# Patient Record
Sex: Female | Born: 1995 | Race: Black or African American | Hispanic: No | Marital: Single | Smoking: Never smoker
Health system: Southern US, Community
[De-identification: ages and names within clinical notes are randomized; demographics above are authoritative.]

## PROBLEM LIST (undated history)

## (undated) DIAGNOSIS — Z789 Other specified health status: Secondary | ICD-10-CM

## (undated) DIAGNOSIS — L239 Allergic contact dermatitis, unspecified cause: Secondary | ICD-10-CM

## (undated) DIAGNOSIS — Z9089 Acquired absence of other organs: Secondary | ICD-10-CM

## (undated) DIAGNOSIS — L709 Acne, unspecified: Secondary | ICD-10-CM

## (undated) HISTORY — DX: Allergic contact dermatitis, unspecified cause: L23.9

## (undated) HISTORY — PX: WISDOM TOOTH EXTRACTION: SHX21

## (undated) HISTORY — PX: ADENOIDECTOMY: SUR15

## (undated) HISTORY — DX: Acne, unspecified: L70.9

## (undated) HISTORY — PX: TONSILLECTOMY: SUR1361

---
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
## 2015-01-10 ENCOUNTER — Telehealth: Payer: Self-pay | Admitting: Family Medicine

## 2015-01-10 NOTE — Telephone Encounter (Signed)
Pt would like to know when she will get an appt to the nutritionists.

## 2015-01-11 NOTE — Telephone Encounter (Signed)
Called patient, had to leave message.

## 2015-01-20 ENCOUNTER — Emergency Department
Admission: EM | Admit: 2015-01-20 | Discharge: 2015-01-20 | Disposition: A | Discharge status: Home / Self Care | Payer: Medicaid Other | Attending: Emergency Medicine | Admitting: Emergency Medicine

## 2015-01-20 DIAGNOSIS — Z3202 Encounter for pregnancy test, result negative: Secondary | ICD-10-CM | POA: Diagnosis not present

## 2015-01-20 DIAGNOSIS — M545 Low back pain, unspecified: Secondary | ICD-10-CM

## 2015-01-20 DIAGNOSIS — R51 Headache: Secondary | ICD-10-CM | POA: Diagnosis not present

## 2015-01-20 DIAGNOSIS — Z79899 Other long term (current) drug therapy: Secondary | ICD-10-CM | POA: Insufficient documentation

## 2015-01-20 DIAGNOSIS — R519 Headache, unspecified: Secondary | ICD-10-CM

## 2015-01-20 DIAGNOSIS — R109 Unspecified abdominal pain: Secondary | ICD-10-CM | POA: Insufficient documentation

## 2015-01-20 LAB — URINALYSIS COMPLETE WITH MICROSCOPIC (ARMC ONLY)
BILIRUBIN URINE: NEGATIVE
Glucose, UA: NEGATIVE mg/dL
HGB URINE DIPSTICK: NEGATIVE
Ketones, ur: NEGATIVE mg/dL
Leukocytes, UA: NEGATIVE
Nitrite: NEGATIVE
PH: 6 (ref 5.0–8.0)
Protein, ur: NEGATIVE mg/dL
Specific Gravity, Urine: 1.013 (ref 1.005–1.030)

## 2015-01-20 MED ORDER — ACETAMINOPHEN 325 MG PO TABS
650.0000 mg | ORAL_TABLET | Freq: Once | ORAL | Status: AC
  Administered 2015-01-20: 650 mg via ORAL
  Filled 2015-01-20: qty 2

## 2015-01-20 NOTE — ED Notes (Signed)
POC urine pregnancy negative. 

## 2015-01-20 NOTE — ED Notes (Signed)
Pt ambulatory to triage with reports that she began having headache and rt flank pain x 3 days. Denies nvd.

## 2015-01-20 NOTE — ED Provider Notes (Addendum)
[facility] Emergency Department Provider Note  ____________________________________________  Time seen: Approximately 9:34 AM  I have reviewed the triage vital signs and the nursing notes.   HISTORY  Chief Complaint Flank Pain and Headache    HPI Ebony [name] is a 19 y.o. female presents today complaining of flank pain on the right. Possibly mild dysuria "I guess maybe". Has had this for a few days. No fever no chills no nausea no vomiting. No abdominal pain. States she has a history of migraines and has a very slight non-worst headache of life headache for the last day or 2 as well. She presents with another patient in the same room with URI complaints. Patient herself has had no URI complaints. She denies any vaginal discharge, she has had no diarrhea, she has had no melena or bright blood per rectum. She does state that she has had pain like this before. She denies any numbness or weakness. No recent trauma.  Past Medical History  Diagnosis Date  . History of tonsillectomy   . Acne   . Eczema, allergic     Patient Active Problem List   Diagnosis Date Noted  . Obesity, pediatric, BMI 95th to 98th percentile for age 61/15/2016  . Intermenstrual bleeding 12/19/2014  . Allergic contact dermatitis 12/17/2014    Past Surgical History  Procedure Laterality Date  . Tonsillectomy    . Adenoidectomy      Current Outpatient Rx  Name  Route  Sig  Dispense  Refill  . etonogestrel (NEXPLANON) 68 MG IMPL implant   Subcutaneous   Inject into the skin.         Marland Kitchen triamcinolone cream (KENALOG) 0.1 %   Topical   Apply 1 application topically daily.   80 g   0     Allergies Review of patient's allergies indicates no known allergies.  Family History  Problem Relation Age of Onset  . Diabetes Brother     Social History Social History  Substance Use Topics  . Smoking status: Never Smoker   . Smokeless tobacco: Never Used  . Alcohol Use: No     Review of Systems Constitutional: No fever/chills Eyes: No visual changes. ENT: No sore throat. Cardiovascular: Denies chest pain. Respiratory: Denies shortness of breath. Gastrointestinal: No abdominal pain.  No nausea, no vomiting.  No diarrhea.  No constipation. Genitourinary: "Maybe a little" dysuria. Musculoskeletal: Negative for back pain. Skin: Negative for rash. Neurological: Negative for headaches, focal weakness or numbness.  10-point ROS otherwise negative.  ____________________________________________   PHYSICAL EXAM:  VITAL SIGNS: ED Triage Vitals  Enc Vitals Group     BP 01/20/15 0841 121/74 mmHg     Pulse Rate 01/20/15 0841 91     Resp 01/20/15 0841 18     Temp 01/20/15 0841 98.4 F (36.9 C)     Temp Source 01/20/15 0841 Oral     SpO2 01/20/15 0841 97 %     Weight 01/20/15 0841 184 lb (83.462 kg)     Height 01/20/15 0841 5\' 6"  (1.676 m)     Head Cir --      Peak Flow --      Pain Score 01/20/15 0842 10     Pain Loc --      Pain Edu? --      Excl. in GC? --     Constitutional: Alert and oriented. Well appearing and in no acute distress. Laughing and joking with me about her child Eyes: Conjunctivae  are normal. PERRL. EOMI. Head: Atraumatic. Nose: No congestion/rhinnorhea. Mouth/Throat: Mucous membranes are moist.  Oropharynx non-erythematous. Neck: No stridor.   Cardiovascular: Normal rate, regular rhythm. Grossly normal heart sounds.  Good peripheral circulation. Respiratory: Normal respiratory effort.  No retractions. Lungs CTAB. Gastrointestinal: Soft and nontender. No distention. No abdominal bruits. There is slight right CVA tenderness by goes towards the midback Musculoskeletal: No lower extremity tenderness nor edema.  No joint effusions. Neurologic:  Normal speech and language. No gross focal neurologic deficits are appreciated. No gait instability. Skin:  Skin is warm, dry and intact. No rash noted. Psychiatric: Mood and affect are  normal. Speech and behavior are normal.  ____________________________________________   LABS (all labs ordered are listed, but only abnormal results are displayed)  Labs Reviewed  URINE CULTURE  URINALYSIS COMPLETEWITH MICROSCOPIC ([facility] ONLY)   ____________________________________________  _____________________________   PROCEDURES  Procedure(s) performed: None  Critical Care performed: None  ____________________________________________   INITIAL IMPRESSION / ASSESSMENT AND PLAN / ED COURSE  Pertinent labs & imaging results that were available during my care of the patient were reviewed by me and considered in my medical decision making (see chart for details).  Patient is very well-appearing presents today with another patient in the room. She is being checked out because she was here anyway. Patient has had some mild right flank pain. On exam she does have some tenderness there. There is however no evidence of midline tenderness nothing to suggest cauda equina syndrome, there is no abdominal pain there's nothing to suggest at this time referred gallbladder or appendicitis. Could be muscle related back pain or possibly pyelonephritis although lower suspicion. We will check a urine and a urine pregnancy and reassess. Patient also states she has a slight headache which she gets "all the time". She is in no way in any distress and do not think an LP is warranted I do not think a CT is warranted and do not think she has a head bleed I do not think the patient has meningitis or a mass.   ----------------------------------------- 11:14 AM on 01/20/2015 -----------------------------------------  Patient states she feels much better, headache nearly gone, remains fully nonfocal neurologically with no stiff neck or meningismus. At this time, she states her back pain is better as well. She has no flank tenderness only a little bit of paraspinal muscle tenderness. She remains without any  evidence of cauda equina syndrome. Patient feels much better and is eager to go home. We'll discharge her, she has been having these headaches for years off and on, low grade, we will refer her as an outpatient for further evaluation of this. Return precautions for any new or worsening symptoms including headache numbness weakness fever chills nausea vomiting etc. given and understood. Patient also notes that she must follow-up with her child who has a URI.  Of note, she is laughing and joking in the room and well-appearing. She now recollects picking up her son and straining her back doing that. However there is no midline back pain and nothing to indicate the need for imaging at this time. I have advised her to continue using over-the-counter analgesics. She will return if she feels worse. Urine pregnancy was negative. ____________________________________________   FINAL CLINICAL IMPRESSION(S) / ED DIAGNOSES  Final diagnoses:  None     Jeanmarie Plant, MD 01/20/15 1610  Jeanmarie Plant, MD 01/20/15 1115  Jeanmarie Plant, MD 01/20/15 [phone]

## 2015-01-21 LAB — POCT PREGNANCY, URINE: PREG TEST UR: NEGATIVE

## 2015-01-22 LAB — URINE CULTURE: Special Requests: NORMAL

## 2015-03-02 ENCOUNTER — Emergency Department
Admission: EM | Admit: 2015-03-02 | Discharge: 2015-03-02 | Disposition: A | Discharge status: Home / Self Care | Payer: Medicaid Other | Attending: Emergency Medicine | Admitting: Emergency Medicine

## 2015-03-02 ENCOUNTER — Encounter: Payer: Self-pay | Admitting: Emergency Medicine

## 2015-03-02 DIAGNOSIS — Z7952 Long term (current) use of systemic steroids: Secondary | ICD-10-CM | POA: Diagnosis not present

## 2015-03-02 DIAGNOSIS — Z79899 Other long term (current) drug therapy: Secondary | ICD-10-CM | POA: Insufficient documentation

## 2015-03-02 DIAGNOSIS — J0100 Acute maxillary sinusitis, unspecified: Secondary | ICD-10-CM | POA: Insufficient documentation

## 2015-03-02 DIAGNOSIS — R05 Cough: Secondary | ICD-10-CM | POA: Diagnosis present

## 2015-03-02 DIAGNOSIS — J029 Acute pharyngitis, unspecified: Secondary | ICD-10-CM | POA: Insufficient documentation

## 2015-03-02 DIAGNOSIS — H6503 Acute serous otitis media, bilateral: Secondary | ICD-10-CM

## 2015-03-02 MED ORDER — GUAIFENESIN-CODEINE 100-10 MG/5ML PO SOLN: 10.0000 mL | Freq: Three times a day (TID) | ORAL | Status: DC | PRN

## 2015-03-02 MED ORDER — CETIRIZINE HCL 10 MG PO CAPS: 10.0000 mg | ORAL_CAPSULE | Freq: Every day | ORAL | Status: DC

## 2015-03-02 MED ORDER — AMOXICILLIN 500 MG PO TABS: 500.0000 mg | ORAL_TABLET | Freq: Three times a day (TID) | ORAL | Status: DC

## 2015-03-02 NOTE — ED Provider Notes (Signed)
[facility] Emergency Department Provider Note ____________________________________________  Time seen: Approximately 12:18 PM  I have reviewed the triage vital signs and the nursing notes.   HISTORY  Chief Complaint Cough; Sore Throat; and Generalized Body Aches    HPI Ebony [name] is a 19 y.o. female who presents to the emergency department for evaluation of sinus congestion, cough, sore throat, and generalized body aches for 3 weeks with increase in symptoms today.No relief with OTC medications. No sick contacts.    Past Medical History  Diagnosis Date  . History of tonsillectomy   . Acne   . Eczema, allergic     Patient Active Problem List   Diagnosis Date Noted  . Obesity, pediatric, BMI 95th to 98th percentile for age 72/15/2016  . Intermenstrual bleeding 12/19/2014  . Allergic contact dermatitis 12/17/2014    Past Surgical History  Procedure Laterality Date  . Tonsillectomy    . Adenoidectomy      Current Outpatient Rx  Name  Route  Sig  Dispense  Refill  . amoxicillin (AMOXIL) 500 MG tablet   Oral   Take 1 tablet (500 mg total) by mouth 3 (three) times daily.   30 tablet   0   . Cetirizine HCl 10 MG CAPS   Oral   Take 1 capsule (10 mg total) by mouth daily.   30 capsule   3   . etonogestrel (NEXPLANON) 68 MG IMPL implant   Subcutaneous   Inject into the skin.         Marland Kitchen guaiFENesin-codeine 100-10 MG/5ML syrup   Oral   Take 10 mLs by mouth 3 (three) times daily as needed.   120 mL   0   . triamcinolone cream (KENALOG) 0.1 %   Topical   Apply 1 application topically daily.   80 g   0     Allergies Review of patient's allergies indicates no known allergies.  Family History  Problem Relation Age of Onset  . Diabetes Brother     Social History Social History  Substance Use Topics  . Smoking status: Never Smoker   . Smokeless tobacco: Never Used  . Alcohol Use: No    Review of  Systems Constitutional: No fever/chills Eyes: No visual changes. ENT: Positive sore throat. Cardiovascular: Denies chest pain. Respiratory: Positive shortness of breath. Positive for cough. Gastrointestinal: Negative for abdominal pain. Negative for nausea,  Negative for vomiting.  Negative for diarrhea.  Genitourinary: Negative for dysuria. Musculoskeletal: Positive for body aches Skin: Negative for rash. Neurological: Negative for headaches, Negative for focal weakness or numbness.  10-point ROS otherwise negative.  ____________________________________________   PHYSICAL EXAM:  VITAL SIGNS: ED Triage Vitals  Enc Vitals Group     BP 03/02/15 1115 113/81 mmHg     Pulse Rate 03/02/15 1115 98     Resp 03/02/15 1115 20     Temp 03/02/15 1115 97.2 F (36.2 C)     Temp Source 03/02/15 1115 Oral     SpO2 03/02/15 1115 100 %     Weight 03/02/15 1115 184 lb (83.462 kg)     Height 03/02/15 1115  (1.676 m)     Head Cir --      Peak Flow --      Pain Score 03/02/15 1116 9     Pain Loc --      Pain Edu? --      Excl. in GC? --     Constitutional: Alert and oriented.  Well appearing and in no acute distress. Eyes: Conjunctivae are normal. PERRL. EOMI. Ears: Bilateral TM with serous fluid Head: Atraumatic. Nose: No congestion/rhinnorhea. Maxillary sinus pain and pressure bilaterally, more on the left.  Mouth/Throat: Mucous membranes are moist.  Oropharynx non-erythematous. Neck: No stridor.  Lymphatic: No cervical lymphadenopathy. Cardiovascular: Normal rate, regular rhythm. Grossly normal heart sounds.  Good peripheral circulation. Respiratory: Normal respiratory effort.  No retractions. Lungs clear to auscultation. Gastrointestinal: Soft and nontender. No distention. No abdominal bruits. No CVA tenderness. Musculoskeletal: No joint pain reported. Neurologic:  Normal speech and language. No gross focal neurologic deficits are appreciated. Speech is normal. No gait  instability. Skin:  Skin is warm, dry and intact. No rash noted. Psychiatric: Mood and affect are normal. Speech and behavior are normal.  ____________________________________________   LABS (all labs ordered are listed, but only abnormal results are displayed)  Labs Reviewed - No data to display ____________________________________________  EKG   ____________________________________________  RADIOLOGY  Not indicated ____________________________________________   PROCEDURES  Procedure(s) performed: None  Critical Care performed: No  ____________________________________________   INITIAL IMPRESSION / ASSESSMENT AND PLAN / ED COURSE  Pertinent labs & imaging results that were available during my care of the patient were reviewed by me and considered in my medical decision making (see chart for details).   Patient was advised to follow-up with her primary care provider for symptoms that are not improving over the next 2-3 days. She was advised to return to the emergency department for symptoms that change or worsen if unable to schedule an appointment. ____________________________________________   FINAL CLINICAL IMPRESSION(S) / ED DIAGNOSES  Final diagnoses:  Acute maxillary sinusitis, recurrence not specified  Bilateral acute serous otitis media, recurrence not specified       Chinita PesterCari B Zoran Yankee, FNP 03/02/15 1542  Emily FilbertJonathan E Williams, MD 03/03/15 2249

## 2015-03-02 NOTE — ED Notes (Signed)
Pt to ed with c/o cough, congestion, runny nose, sore throat and body aches x 3 weeks, denies fever.

## 2015-03-07 ENCOUNTER — Ambulatory Visit: Payer: Self-pay | Admitting: Dietician

## 2015-03-21 ENCOUNTER — Encounter: Payer: Self-pay | Admitting: Dietician

## 2015-03-21 ENCOUNTER — Encounter: Payer: Medicaid Other | Attending: Family Medicine | Admitting: Dietician

## 2015-03-21 VITALS — Ht 67.0 in | Wt 189.5 lb

## 2015-03-21 DIAGNOSIS — E669 Obesity, unspecified: Secondary | ICD-10-CM | POA: Insufficient documentation

## 2015-03-21 NOTE — Patient Instructions (Signed)
Decrease sweetened tea. Take bottled water for morning drink rather than tea. Try diet green tea. Limit fries or any fried potatoes to once per day. Balance meals with protein, starch and large portion of non-starchy vegetables. Exercise goal: 3-4 days/week walking or [employer]

## 2015-03-21 NOTE — Progress Notes (Signed)
Medical Nutrition Therapy: Visit start time: 10:15am  end time: 11:15am Assessment:  Diagnosis: obesity Psychosocial issues/ stress concerns: none identified Preferred learning method:  . Auditory Current weight: 189.5 lbs  Height: 67 in Medications, supplements: none Progress and evaluation: Patient accompanied by a female friend in for initial nutrition assessment. She states that her doctor told her she was overweight and suggested she see a dietitian. She rates her motivation to make diet changes as a "5" on a 0-10 scale. She estimates she has gained approximately 50 lbs in the past year. Problem areas are excessive intake of sweet tea (up to 1/2 gallon per day) and frequent intake of high fat foods, typically from fast food restaurants. She includes fries or some type of fried potatoes 2-3x/day.  She likes a variety of fruits and vegetables. Physical activity: Has begun walking for exercise- 1-2x/week and has just joined [employer].  Dietary Intake:  Usual eating pattern includes 3 meals and 1-2 snacks per day. Dining out frequency: 10 meals per week.  Breakfast: Picks up a sausage biscuit or chicken biscuit, fries and sweet tea on her way to her grandmother's most mornings. Lunch: 12:00- eats lunch that grandmother prepares on most days:Ex. Fried chicken, mashed potatoes, green beans, sweet tea or Koolaide Snack: 3:00pm- Fast food sandwich such as a [employer]"Whopper", fries, tea Supper: 6:00pm- her mother prepares most of these meals: Ex. Pork chop, mac.'n cheese, biscuits, tea Snack:  Usually no snack after dinner Beverages: sweet tea, water, Koolaide  Nutrition Care Education: Weight control: Discussed taking steps to make positive diet changes as a process vs. restrictive dieting. Obtained typical meal pattern and food choices and discussed ways to decrease sugar, fat and therefore calories in her current diet. Discussed how decreasing sweet tea could decrease calories by 500 or more.  Discussed balancing a high fat food choice with a low fat choice such as non-starchy vegetables or fruit or both. Worked with patient to set goals she felt were realistic for her.  Nutritional Diagnosis:  [place]-3.3 Overweight/obesity As related to high intake of sweet tea and frequent high fat food choices.  As evidenced by diet history and weight gain in past year..  Intervention:  Decrease sweetened tea. Take bottled water for morning drink rather than tea. Try diet green tea. Limit fries or any fried potatoes to once per day. Balance meals with protein, starch and large portion of non-starchy vegetables. Exercise goal: 3-4 days/week walking or [employer]   Education Materials given:  . Plate Planner . Food lists/ Planning A Balanced Meal . Sample meal pattern/ menus . Goals/ instructions Learner/ who was taught:  . Patient  Level of understanding: . Partial understanding; needs review/ practice Learning barriers: . None  Willingness to learn/ readiness for change: . Hesitance, contemplating change  Monitoring and Evaluation:  Dietary intake, exercise,and body weight      follow up: 04/18/15 at 10:30

## 2015-04-05 ENCOUNTER — Encounter: Payer: Self-pay | Admitting: Family Medicine

## 2015-04-05 ENCOUNTER — Ambulatory Visit: Payer: Medicaid Other | Admitting: Family Medicine

## 2015-04-05 VITALS — BP 102/74 | HR 126 | Temp 98.0°F | Resp 16 | Ht 67.0 in | Wt 183.0 lb

## 2015-04-06 NOTE — Progress Notes (Signed)
Patient came in late and did not want to wait to be seen

## 2015-04-07 ENCOUNTER — Ambulatory Visit: Payer: Medicaid Other | Admitting: Family Medicine

## 2015-04-18 ENCOUNTER — Ambulatory Visit: Payer: Self-pay | Admitting: Dietician

## 2015-05-04 ENCOUNTER — Emergency Department
Admission: EM | Admit: 2015-05-04 | Discharge: 2015-05-04 | Disposition: A | Discharge status: Home / Self Care | Payer: Medicaid Other | Attending: Emergency Medicine | Admitting: Emergency Medicine

## 2015-05-04 DIAGNOSIS — B349 Viral infection, unspecified: Secondary | ICD-10-CM | POA: Diagnosis not present

## 2015-05-04 DIAGNOSIS — R197 Diarrhea, unspecified: Secondary | ICD-10-CM

## 2015-05-04 DIAGNOSIS — Z79899 Other long term (current) drug therapy: Secondary | ICD-10-CM | POA: Insufficient documentation

## 2015-05-04 DIAGNOSIS — R05 Cough: Secondary | ICD-10-CM | POA: Diagnosis present

## 2015-05-04 MED ORDER — CETIRIZINE HCL 10 MG PO CAPS: ORAL_CAPSULE | ORAL | Status: DC

## 2015-05-04 NOTE — ED Notes (Signed)
Pt c/o cough with sinus congestion for the past 2 weeks.

## 2015-05-04 NOTE — ED Notes (Signed)
States she has had a cough and headache for about 2 weeks   States cough is non prod ..Marland Kitchen

## 2015-05-04 NOTE — ED Provider Notes (Signed)
[facility] Emergency Department Provider Note ____________________________________________  Time seen: Approximately 8:30 AM  I have reviewed the triage vital signs and the nursing notes.   HISTORY  Chief Complaint Cough  HPI Ronnell Freshwaterenasia S Bednarz is a 19 y.o. female is here with complaint of cough and headache for asthma 2 weeks. Patient states cough is nonproductive. She has experienced diarrhea 3 in the last 48 hours. She has taken over-the-counter NyQuil, TheraFlu, and Mucinex without any relief. She is unaware of any fever and denies chills. She denies any vomiting. She has not seen her PCP since she has been sick. Currently she is here with her infant child with same symptoms.   Past Medical History  Diagnosis Date  . History of tonsillectomy   . Acne   . Eczema, allergic     Patient Active Problem List   Diagnosis Date Noted  . Obesity, pediatric, BMI 95th to 98th percentile for age 57/15/2016  . Intermenstrual bleeding 12/19/2014  . Allergic contact dermatitis 12/17/2014    Past Surgical History  Procedure Laterality Date  . Tonsillectomy    . Adenoidectomy    . Wisdom tooth extraction      Current Outpatient Rx  Name  Route  Sig  Dispense  Refill  . Cetirizine HCl 10 MG CAPS      Take one tablet each day   30 capsule   0   . etonogestrel (NEXPLANON) 68 MG IMPL implant   Subcutaneous   Inject into the skin.           Allergies Review of patient's allergies indicates no known allergies.  Family History  Problem Relation Age of Onset  . Diabetes Brother     Social History Social History  Substance Use Topics  . Smoking status: Never Smoker   . Smokeless tobacco: Never Used  . Alcohol Use: No    Review of Systems Constitutional: No fever/chills ENT: No sore throat. Cardiovascular: Denies chest pain. Respiratory: Denies shortness of breath. Positive cough. Gastrointestinal: No abdominal pain.  No nausea, no vomiting.   Positive diarrhea.  No constipation. Genitourinary: Negative for dysuria. Musculoskeletal: Negative for back pain. Skin: Negative for rash. Neurological: Positive for headaches, no focal weakness or numbness.  10-point ROS otherwise negative.  ____________________________________________   PHYSICAL EXAM:  VITAL SIGNS: ED Triage Vitals  Enc Vitals Group     BP 05/04/15 0820 110/72 mmHg     Pulse Rate 05/04/15 0820 100     Resp 05/04/15 0820 16     Temp 05/04/15 0820 97.7 F (36.5 C)     Temp Source 05/04/15 0820 Oral     SpO2 05/04/15 0820 99 %     Weight 05/04/15 0820 184 lb (83.462 kg)     Height 05/04/15 0820 5\' 5"  (1.651 m)     Head Cir --      Peak Flow --      Pain Score --      Pain Loc --      Pain Edu? --      Excl. in GC? --     Constitutional: Alert and oriented. Well appearing and in no acute distress. Eyes: Conjunctivae are normal. PERRL. EOMI. Head: Atraumatic. Nose: Mild congestion/no rhinnorhea. EACs and TMs are clear bilaterally. Mouth/Throat: Mucous membranes are moist.  Oropharynx non-erythematous. Neck: No stridor.   Hematological/Lymphatic/Immunilogical: No cervical lymphadenopathy. Cardiovascular: Normal rate, regular rhythm. Grossly normal heart sounds.  Good peripheral circulation. Respiratory: Normal respiratory effort.  No retractions.  Lungs CTAB. Gastrointestinal: Soft and nontender. No distention. Bowel sounds normoactive 4 quadrants. Musculoskeletal: No lower extremity tenderness nor edema.  No joint effusions. Neurologic:  Normal speech and language. No gross focal neurologic deficits are appreciated. No gait instability. Skin:  Skin is warm, dry and intact. No rash noted. Psychiatric: Mood and affect are normal. Speech and behavior are normal.  ____________________________________________   LABS (all labs ordered are listed, but only abnormal results are displayed)  Labs Reviewed - No data to  display ____________________________________________ _________________________________________   PROCEDURES  Procedure(s) performed: None  Critical Care performed: No  ____________________________________________   INITIAL IMPRESSION / ASSESSMENT AND PLAN / ED COURSE  Pertinent labs & imaging results that were available during my care of the patient were reviewed by me and considered in my medical decision making (see chart for details).  Patient was told to take Tylenol or ibuprofen as needed for fever or chills. She is to remain on clear liquids for the next 24 hours. She was given a prescription for Claritin one per day as needed for nasal congestion. She will need to follow-up with her PCP if any continued problems. ____________________________________________   FINAL CLINICAL IMPRESSION(S) / ED DIAGNOSES  Final diagnoses:  Viral illness  Diarrhea, unspecified type      Tommi Rumps, PA-C 05/04/15 [id number]  Sharman Cheek, MD 05/04/15 1550

## 2015-05-05 ENCOUNTER — Encounter: Payer: Self-pay | Admitting: Dietician

## 2015-05-11 ENCOUNTER — Ambulatory Visit: Payer: Medicaid Other

## 2015-05-27 ENCOUNTER — Other Ambulatory Visit: Payer: Self-pay | Admitting: Pediatrics

## 2015-05-27 DIAGNOSIS — Z207 Contact with and (suspected) exposure to pediculosis, acariasis and other infestations: Secondary | ICD-10-CM

## 2015-05-27 DIAGNOSIS — Z2089 Contact with and (suspected) exposure to other communicable diseases: Secondary | ICD-10-CM

## 2015-05-27 MED ORDER — PERMETHRIN 5 % EX CREA: 1.0000 "application " | TOPICAL_CREAM | Freq: Once | CUTANEOUS | Status: DC

## 2015-05-27 NOTE — Progress Notes (Signed)
Household contact was diagnosed with scabies in clinic.  All household contacts are being treated for scabies.

## 2017-03-19 ENCOUNTER — Emergency Department
Admission: EM | Admit: 2017-03-19 | Discharge: 2017-03-19 | Disposition: A | Discharge status: Home / Self Care | Payer: Medicaid Other | Attending: Emergency Medicine | Admitting: Emergency Medicine

## 2017-03-19 ENCOUNTER — Other Ambulatory Visit: Payer: Self-pay

## 2017-03-19 DIAGNOSIS — Z79899 Other long term (current) drug therapy: Secondary | ICD-10-CM | POA: Insufficient documentation

## 2017-03-19 DIAGNOSIS — R109 Unspecified abdominal pain: Secondary | ICD-10-CM | POA: Diagnosis present

## 2017-03-19 DIAGNOSIS — R102 Pelvic and perineal pain: Secondary | ICD-10-CM | POA: Insufficient documentation

## 2017-03-19 DIAGNOSIS — R1084 Generalized abdominal pain: Secondary | ICD-10-CM | POA: Diagnosis not present

## 2017-03-19 LAB — HEPATIC FUNCTION PANEL
ALT: 10 U/L — ABNORMAL LOW (ref 14–54)
AST: 15 U/L (ref 15–41)
Albumin: 4.2 g/dL (ref 3.5–5.0)
Alkaline Phosphatase: 45 U/L (ref 38–126)
BILIRUBIN INDIRECT: 0.9 mg/dL (ref 0.3–0.9)
BILIRUBIN TOTAL: 1.1 mg/dL (ref 0.3–1.2)
Bilirubin, Direct: 0.2 mg/dL (ref 0.1–0.5)
Total Protein: 7.5 g/dL (ref 6.5–8.1)

## 2017-03-19 LAB — CBC WITH DIFFERENTIAL/PLATELET
BASOS PCT: 1 %
Basophils Absolute: 0 10*3/uL (ref 0–0.1)
EOS PCT: 4 %
Eosinophils Absolute: 0.1 10*3/uL (ref 0–0.7)
HCT: 40.3 % (ref 35.0–47.0)
HEMOGLOBIN: 13.4 g/dL (ref 12.0–16.0)
Lymphocytes Relative: 46 %
Lymphs Abs: 1.7 10*3/uL (ref 1.0–3.6)
MCH: 30.9 pg (ref 26.0–34.0)
MCHC: 33.3 g/dL (ref 32.0–36.0)
MCV: 92.7 fL (ref 80.0–100.0)
MONOS PCT: 10 %
Monocytes Absolute: 0.4 10*3/uL (ref 0.2–0.9)
Neutro Abs: 1.4 10*3/uL (ref 1.4–6.5)
Neutrophils Relative %: 39 %
PLATELETS: 148 10*3/uL — AB (ref 150–440)
RBC: 4.35 MIL/uL (ref 3.80–5.20)
RDW: 13 % (ref 11.5–14.5)
WBC: 3.6 10*3/uL (ref 3.6–11.0)

## 2017-03-19 LAB — BASIC METABOLIC PANEL
Anion gap: 9 (ref 5–15)
BUN: 10 mg/dL (ref 6–20)
CALCIUM: 9.5 mg/dL (ref 8.9–10.3)
CO2: 25 mmol/L (ref 22–32)
Chloride: 104 mmol/L (ref 101–111)
Creatinine, Ser: 0.65 mg/dL (ref 0.44–1.00)
GFR calc Af Amer: >60 mL/min (ref >60)
GLUCOSE: 86 mg/dL (ref 65–99)
Potassium: 3.8 mmol/L (ref 3.5–5.1)
Sodium: 138 mmol/L (ref 135–145)

## 2017-03-19 LAB — LIPASE, BLOOD: Lipase: 22 U/L (ref 11–51)

## 2017-03-19 LAB — HCG, QUANTITATIVE, PREGNANCY: hCG, Beta Chain, Quant, S: <1 m[IU]/mL (ref <5)

## 2017-03-19 MED ORDER — SODIUM CHLORIDE 0.9 % IV BOLUS (SEPSIS)
1000.0000 mL | Freq: Once | INTRAVENOUS | Status: AC
  Administered 2017-03-19: 1000 mL via INTRAVENOUS

## 2017-03-19 MED ORDER — KETOROLAC TROMETHAMINE 30 MG/ML IJ SOLN
15.0000 mg | Freq: Once | INTRAMUSCULAR | Status: AC
  Administered 2017-03-19: 15 mg via INTRAVENOUS
  Filled 2017-03-19: qty 1

## 2017-03-19 MED ORDER — IBUPROFEN 600 MG PO TABS: 600.0000 mg | ORAL_TABLET | Freq: Three times a day (TID) | ORAL | 0 refills | Status: DC | PRN

## 2017-03-19 NOTE — ED Provider Notes (Signed)
[facility] Emergency Department Provider Note  ____________________________________________   First MD Initiated Contact with Patient 03/19/17 0820     (approximate)  I have reviewed the triage vital signs and the nursing notes.   HISTORY  Chief Complaint Abdominal Pain    HPI Ebony [name] is a 21 y.o. female who self presents to the emergency department with roughly 24 hours of constant aching discomfort in her suprapubic region.  The pain is moderate severity aching.  Nothing seems to make it better or worse.  No fevers or chills.  No nausea or vomiting.  No history of abdominal surgeries.  She began her menstrual period 4 days ago.  She feels like this is heavier than her normal periods.  She has been going through 7 tampons a day which is more than she usually does.  She normally has her period for 6 days.  She has not had sex since the pain started.  Past Medical History:  Diagnosis Date  . Acne   . Eczema, allergic   . History of tonsillectomy     Patient Active Problem List   Diagnosis Date Noted  . Obesity, pediatric, BMI 95th to 98th percentile for age 13/15/2016  . Intermenstrual bleeding 12/19/2014  . Allergic contact dermatitis 12/17/2014    Past Surgical History:  Procedure Laterality Date  . ADENOIDECTOMY    . TONSILLECTOMY    . WISDOM TOOTH EXTRACTION      Prior to Admission medications   Medication Sig Start Date End Date Taking? Authorizing Provider  Cetirizine HCl 10 MG CAPS Take one tablet each day 05/04/15   Tommi RumpsSummers, Rhonda L, PA-C  etonogestrel (NEXPLANON) 68 MG IMPL implant Inject into the skin.    [provider]  ibuprofen (ADVIL,MOTRIN) 600 MG tablet Take 1 tablet (600 mg total) every 8 (eight) hours as needed by mouth. 03/19/17   Merrily Brittleifenbark, Vermelle Cammarata, MD  permethrin (ACTICIN) 5 % cream Apply 1 application topically once. 05/27/15   Voncille LoEttefagh, Kate, MD    Allergies Patient has no known allergies.  Family  History  Problem Relation Age of Onset  . Diabetes Brother     Social History Social History   Tobacco Use  . Smoking status: Never Smoker  . Smokeless tobacco: Never Used  Substance Use Topics  . Alcohol use: No    Alcohol/week: 0.0 oz  . Drug use: Yes    Frequency: 2.0 times per week    Types: Marijuana    Review of Systems Constitutional: No fever/chills Eyes: No visual changes. ENT: No sore throat. Cardiovascular: Denies chest pain. Respiratory: Denies shortness of breath. Gastrointestinal: Positive for abdominal pain.  No nausea, no vomiting.  No diarrhea.  No constipation. Genitourinary: Negative for dysuria. Musculoskeletal: Negative for back pain. Skin: Negative for rash. Neurological: Negative for headaches, focal weakness or numbness.   ____________________________________________   PHYSICAL EXAM:  VITAL SIGNS: ED Triage Vitals  Enc Vitals Group     BP 03/19/17 0735 111/66     Pulse Rate 03/19/17 0735 63     Resp 03/19/17 0735 16     Temp 03/19/17 0735 (!) 97.5 F (36.4 C)     Temp Source 03/19/17 0735 Oral     SpO2 03/19/17 0735 99 %     Weight 03/19/17 0735 175 lb (79.4 kg)     Height 03/19/17 0735 5\' 6"  (1.676 m)     Head Circumference --      Peak Flow --  Pain Score 03/19/17 0734 8     Pain Loc --      Pain Edu? --      Excl. in GC? --     Constitutional: Alert and oriented x4 very well-appearing nontoxic no diaphoresis speaks in full clear sentences Eyes: PERRL EOMI. Head: Atraumatic. Nose: No congestion/rhinnorhea. Mouth/Throat: No trismus Neck: No stridor.   Cardiovascular: Normal rate, regular rhythm. Grossly normal heart sounds.  Good peripheral circulation. Respiratory: Normal respiratory effort.  No retractions. Lungs CTAB and moving good air Gastrointestinal: Soft nondistended nontender no rebound no guarding no peritonitis no McBurney's tenderness 6 no costovertebral tenderness negative Murphy's Musculoskeletal: No lower  extremity edema   Neurologic:  Normal speech and language. No gross focal neurologic deficits are appreciated. Skin:  Skin is warm, dry and intact. No rash noted. Psychiatric: Mood and affect are normal. Speech and behavior are normal.    ____________________________________________   DIFFERENTIAL includes but not limited to  Appendicitis, diverticulitis, menstrual cramping, ovarian torsion, ovarian cyst ____________________________________________   LABS (all labs ordered are listed, but only abnormal results are displayed)  Labs Reviewed  HEPATIC FUNCTION PANEL - Abnormal; Notable for the following components:      Result Value   ALT 10 (*)    All other components within normal limits  CBC WITH DIFFERENTIAL/PLATELET - Abnormal; Notable for the following components:   Platelets 148 (*)    All other components within normal limits  BASIC METABOLIC PANEL  LIPASE, BLOOD  HCG, QUANTITATIVE, PREGNANCY    Blood work reviewed and interpreted by me with no acute disease noted __________________________________________  EKG   ____________________________________________  RADIOLOGY   ____________________________________________   PROCEDURES  Procedure(s) performed: no  Procedures  Critical Care performed: no  Observation: no ____________________________________________   INITIAL IMPRESSION / ASSESSMENT AND PLAN / ED COURSE  Pertinent labs & imaging results that were available during my care of the patient were reviewed by me and considered in my medical decision making (see chart for details).  The patient arrives hemodynamically stable and very well-appearing.  Her abdominal exam is extremely benign.  Her history is not consistent with appendicitis.     ----------------------------------------- 10:23 AM on 03/19/2017 -----------------------------------------  The patient's blood work is unremarkable.  She is unable to urinate.  We discussed the risks and  benefits of a CT scan now versus going home with a 24-hour abdominal recheck and she would prefer to go home.  At this point the patient is medically stable for outpatient management verbalizes understanding and agreement with the plan. ____________________________________________   FINAL CLINICAL IMPRESSION(S) / ED DIAGNOSES  Final diagnoses:  Generalized abdominal pain      NEW MEDICATIONS STARTED DURING THIS VISIT:  This SmartLink is deprecated. Use AVSMEDLIST instead to display the medication list for a patient.   Note:  This document was prepared using Dragon voice recognition software and may include unintentional dictation errors.     Merrily Brittleifenbark, Ac Colan, MD 03/20/17 1409

## 2017-03-19 NOTE — ED Triage Notes (Signed)
Pt c/o RLQ pain, states "i dont know if its related to my period or not" states the pain started yesterday, she started her cycle on Sunday. Denies any urinary sx. Denies N/V/D

## 2017-03-19 NOTE — ED Notes (Signed)
Patient ambulatory to restroom. Steady gait noted. Patient reports she is still unable to obtain urine sample. MD made aware.

## 2017-03-19 NOTE — Discharge Instructions (Signed)
Fortunately today your blood work was very reassuring.  Please take your pain medication as needed for severe symptoms and return to the emergency department immediately for any new or worsening symptoms such as fevers, chills, worsening pain, if you cannot eat or drink, or for any other issues whatsoever.  I do need a doctor to examine you tomorrow to make sure your stomach is improving.  You can follow-up with your primary care physician, urgent care, or of course we are more than happy to see you in the emergency department.  It was a pleasure to take care of you today, and thank you for coming to our emergency department.  If you have any questions or concerns before leaving please ask the nurse to grab me and I'm more than happy to go through your aftercare instructions again.  If you were prescribed any opioid pain medication today such as Norco, Vicodin, Percocet, morphine, hydrocodone, or oxycodone please make sure you do not drive when you are taking this medication as it can alter your ability to drive safely.  If you have any concerns once you are home that you are not improving or are in fact getting worse before you can make it to your follow-up appointment, please do not hesitate to call 911 and come back for further evaluation.  Ebony BrittleNeil [name] Paulette, MD  Results for orders placed or performed during the hospital encounter of 03/19/17  Basic metabolic panel  Result Value Ref Range   Sodium 138 135 - 145 mmol/L   Potassium 3.8 3.5 - 5.1 mmol/L   Chloride 104 101 - 111 mmol/L   CO2 25 22 - 32 mmol/L   Glucose, Bld 86 65 - 99 mg/dL   BUN 10 6 - 20 mg/dL   Creatinine, Ser 1.610.65 0.44 - 1.00 mg/dL   Calcium 9.5 8.9 - 09.610.3 mg/dL   GFR calc non Af Amer >60 >60 mL/min   GFR calc Af Amer >60 >60 mL/min   Anion gap 9 5 - 15  Hepatic function panel  Result Value Ref Range   Total Protein 7.5 6.5 - 8.1 g/dL   Albumin 4.2 3.5 - 5.0 g/dL   AST 15 15 - 41 U/L   ALT 10 (L) 14 - 54 U/L   Alkaline Phosphatase 45 38 - 126 U/L   Total Bilirubin 1.1 0.3 - 1.2 mg/dL   Bilirubin, Direct 0.2 0.1 - 0.5 mg/dL   Indirect Bilirubin 0.9 0.3 - 0.9 mg/dL  Lipase, blood  Result Value Ref Range   Lipase 22 11 - 51 U/L  CBC with Differential  Result Value Ref Range   WBC 3.6 3.6 - 11.0 K/uL   RBC 4.35 3.80 - 5.20 MIL/uL   Hemoglobin 13.4 12.0 - 16.0 g/dL   HCT 04.540.3 40.935.0 - 81.147.0 %   MCV 92.7 80.0 - 100.0 fL   MCH 30.9 26.0 - 34.0 pg   MCHC 33.3 32.0 - 36.0 g/dL   RDW 91.413.0 78.211.5 - 95.614.5 %   Platelets 148 (L) 150 - 440 K/uL   Neutrophils Relative % 39 %   Neutro Abs 1.4 1.4 - 6.5 K/uL   Lymphocytes Relative 46 %   Lymphs Abs 1.7 1.0 - 3.6 K/uL   Monocytes Relative 10 %   Monocytes Absolute 0.4 0.2 - 0.9 K/uL   Eosinophils Relative 4 %   Eosinophils Absolute 0.1 0 - 0.7 K/uL   Basophils Relative 1 %   Basophils Absolute 0.0 0 - 0.1 K/uL  hCG,  quantitative, pregnancy  Result Value Ref Range   hCG, Beta Chain, Quant, S <1 <5 mIU/mL

## 2017-04-13 ENCOUNTER — Encounter: Payer: Self-pay | Admitting: Emergency Medicine

## 2017-04-13 ENCOUNTER — Emergency Department
Admission: EM | Admit: 2017-04-13 | Discharge: 2017-04-13 | Disposition: A | Discharge status: Home / Self Care | Payer: Medicaid Other | Attending: Emergency Medicine | Admitting: Emergency Medicine

## 2017-04-13 ENCOUNTER — Emergency Department: Payer: Medicaid Other

## 2017-04-13 ENCOUNTER — Other Ambulatory Visit: Payer: Self-pay

## 2017-04-13 DIAGNOSIS — O9989 Other specified diseases and conditions complicating pregnancy, childbirth and the puerperium: Secondary | ICD-10-CM | POA: Insufficient documentation

## 2017-04-13 DIAGNOSIS — Z3490 Encounter for supervision of normal pregnancy, unspecified, unspecified trimester: Secondary | ICD-10-CM | POA: Diagnosis not present

## 2017-04-13 DIAGNOSIS — R1030 Lower abdominal pain, unspecified: Secondary | ICD-10-CM | POA: Diagnosis present

## 2017-04-13 LAB — COMPREHENSIVE METABOLIC PANEL
ALBUMIN: 4.1 g/dL (ref 3.5–5.0)
ALK PHOS: 44 U/L (ref 38–126)
ALT: 11 U/L — AB (ref 14–54)
AST: 16 U/L (ref 15–41)
Anion gap: 9 (ref 5–15)
BUN: 11 mg/dL (ref 6–20)
CALCIUM: 8.9 mg/dL (ref 8.9–10.3)
CHLORIDE: 106 mmol/L (ref 101–111)
CO2: 23 mmol/L (ref 22–32)
CREATININE: 0.59 mg/dL (ref 0.44–1.00)
GFR calc non Af Amer: >60 mL/min (ref >60)
GLUCOSE: 77 mg/dL (ref 65–99)
Potassium: 3.7 mmol/L (ref 3.5–5.1)
SODIUM: 138 mmol/L (ref 135–145)
Total Bilirubin: 0.9 mg/dL (ref 0.3–1.2)
Total Protein: 7.4 g/dL (ref 6.5–8.1)

## 2017-04-13 LAB — URINALYSIS, COMPLETE (UACMP) WITH MICROSCOPIC
BILIRUBIN URINE: NEGATIVE
Bacteria, UA: NONE SEEN
Glucose, UA: NEGATIVE mg/dL
HGB URINE DIPSTICK: NEGATIVE
KETONES UR: NEGATIVE mg/dL
Leukocytes, UA: NEGATIVE
Nitrite: NEGATIVE
Protein, ur: 30 mg/dL — AB
SPECIFIC GRAVITY, URINE: 1.025 (ref 1.005–1.030)
pH: 6 (ref 5.0–8.0)

## 2017-04-13 LAB — HCG, QUANTITATIVE, PREGNANCY: hCG, Beta Chain, Quant, S: 87 m[IU]/mL — ABNORMAL HIGH (ref <5)

## 2017-04-13 LAB — CBC
HCT: 39.1 % (ref 35.0–47.0)
HEMOGLOBIN: 13.1 g/dL (ref 12.0–16.0)
MCH: 30.9 pg (ref 26.0–34.0)
MCHC: 33.6 g/dL (ref 32.0–36.0)
MCV: 92.2 fL (ref 80.0–100.0)
PLATELETS: 167 10*3/uL (ref 150–440)
RBC: 4.24 MIL/uL (ref 3.80–5.20)
RDW: 13.1 % (ref 11.5–14.5)
WBC: 4 10*3/uL (ref 3.6–11.0)

## 2017-04-13 LAB — LIPASE, BLOOD: LIPASE: 26 U/L (ref 11–51)

## 2017-04-13 LAB — POCT PREGNANCY, URINE: PREG TEST UR: POSITIVE — AB

## 2017-04-13 NOTE — ED Notes (Signed)
NAD noted at time of D/C. Pt denies questions or concerns. Pt ambulatory to the lobby at this time.  

## 2017-04-13 NOTE — ED Provider Notes (Signed)
Sign out from Dr. Cyril LoosenKinner in this 21 year old female without any history of ectopic pregnancy or STD who is presenting with abdominal cramping and a "funny feeling" to her lower abdomen.  She had a beta-hCG of 87 and reports her last period being this past November, about the 17th.  Plan is to follow-up with her ultrasound.  No reports of bleeding.  Physical Exam  BP 108/65 (BP Location: Left Arm)   Pulse 88   Temp 98.2 F (36.8 C) (Oral)   Resp 18   Ht 5\' 6"  (1.676 m)   Wt 79.4 kg (175 lb)   LMP 03/22/2017 (Approximate)   SpO2 97%   BMI 28.25 kg/m  ----------------------------------------- 4:41 PM on 04/13/2017 -----------------------------------------   Physical Exam Patient without any distress.  Abdomen palpated in the no tenderness throughout.  Belly is soft. ED Course/Procedures     Procedures  MDM  Ultrasound without any intrauterine pregnancy identified.  Cystic area in the left ovary measuring up to 2.4 cm which likely represent the corpus luteum cyst.  I discussed the imaging results as well as the patient's clinical course with her.  She said that she never had overt pain or was ever having any severe pain or doubled over.  Given the patient's presentation, exam as well as very low hCG level, I believe that she is appropriate for discharge and repeat beta-hCG in 48 hours.  [facility] has [facility] in the past.  I discussed with her in detail return precautions including any worsening or concerning symptoms that may include but are not limited to worsening abdominal pain or vaginal bleeding.  We did discuss that it is not completely ruled out that the patient has an ectopic pregnancy and that she must present 48 hours for repeat blood check of her hCG hormone level.  She says that she will call first thing in the morning back to [facility] and if they are closed tomorrow due to increment weather that we are experiencing that she will call first thing on Tuesday  morning.  She is understanding of this plan willing to comply.       Myrna BlazerSchaevitz, David Matthew, MD 04/13/17 [phone]

## 2017-04-13 NOTE — ED Triage Notes (Signed)
Pt presents via pov with lower abdominal pain x 2 days. Pt endorses painful urination, increased frequency. Pt alert & oriented with NAD noted.

## 2017-04-13 NOTE — ED Provider Notes (Signed)
[facility] Emergency Department Provider Note   ____________________________________________    I have reviewed the triage vital signs and the nursing notes.   HISTORY  Chief Complaint Abdominal Pain     HPI Ebony [name] is a 21 y.o. female presents with complaints of mild lower abdominal discomfort.  Patient reports aching discomfort in her lower abdomen over the last 2-3 days.  She denies dysuria but reports it is somewhat uncomfortable.  Denies vaginal discharge or vaginal bleeding.  Last period was November 17.  She was unaware that she was pregnant.  G2.  No fevers or chills.  No back pain.  No radiation of pain.  She has not taken anything for this.   Past Medical History:  Diagnosis Date  . Acne   . Eczema, allergic   . History of tonsillectomy     Patient Active Problem List   Diagnosis Date Noted  . Obesity, pediatric, BMI 95th to 98th percentile for age 51/15/2016  . Intermenstrual bleeding 12/19/2014  . Allergic contact dermatitis 12/17/2014    Past Surgical History:  Procedure Laterality Date  . ADENOIDECTOMY    . TONSILLECTOMY    . WISDOM TOOTH EXTRACTION      Prior to Admission medications   Medication Sig Start Date End Date Taking? Authorizing Provider  etonogestrel (NEXPLANON) 68 MG IMPL implant Inject into the skin.    [provider]     Allergies Patient has no known allergies.  Family History  Problem Relation Age of Onset  . Diabetes Brother     Social History Social History   Tobacco Use  . Smoking status: Never Smoker  . Smokeless tobacco: Never Used  Substance Use Topics  . Alcohol use: No    Alcohol/week: 0.0 oz  . Drug use: Yes    Frequency: 2.0 times per week    Types: Marijuana    Review of Systems  Constitutional: No fever/chills Eyes: No visual changes.  ENT: No sore throat. Cardiovascular: Denies chest pain. Respiratory: Denies shortness of breath. Gastrointestinal:  As above Genitourinary: Negative for dysuria. Musculoskeletal: Negative for back pain. Skin: Negative for rash. Neurological: Negative for headaches or weakness   ____________________________________________   PHYSICAL EXAM:  VITAL SIGNS: ED Triage Vitals  Enc Vitals Group     BP 04/13/17 1317 108/65     Pulse Rate 04/13/17 1317 88     Resp 04/13/17 1317 18     Temp 04/13/17 1317 98.2 F (36.8 C)     Temp Source 04/13/17 1317 Oral     SpO2 04/13/17 1317 97 %     Weight 04/13/17 1318 79.4 kg (175 lb)     Height 04/13/17 1318 1.676 m (5\' 6" )     Head Circumference --      Peak Flow --      Pain Score 04/13/17 1317 6     Pain Loc --      Pain Edu? --      Excl. in GC? --     Constitutional: Alert and oriented. No acute distress. Pleasant and interactive Eyes: Conjunctivae are normal.   Nose: No congestion/rhinnorhea. Mouth/Throat: Mucous membranes are moist.    Cardiovascular: Normal rate, regular rhythm. Grossly normal heart sounds.  Good peripheral circulation. Respiratory: Normal respiratory effort.  No retractions. Lungs CTAB. Gastrointestinal: Soft and nontender. No distention.  No CVA tenderness. Genitourinary: deferred Musculoskeletal: No lower extremity tenderness nor edema.  Warm and well perfused Neurologic:  Normal speech and  language. No gross focal neurologic deficits are appreciated.  Skin:  Skin is warm, dry and intact. No rash noted. Psychiatric: Mood and affect are normal. Speech and behavior are normal.  ____________________________________________   LABS (all labs ordered are listed, but only abnormal results are displayed)  Labs Reviewed  COMPREHENSIVE METABOLIC PANEL - Abnormal; Notable for the following components:      Result Value   ALT 11 (*)    All other components within normal limits  URINALYSIS, COMPLETE (UACMP) WITH MICROSCOPIC - Abnormal; Notable for the following components:   Color, Urine YELLOW (*)    APPearance HAZY (*)     Protein, ur 30 (*)    Squamous Epithelial / LPF 0-5 (*)    All other components within normal limits  POCT PREGNANCY, URINE - Abnormal; Notable for the following components:   Preg Test, Ur POSITIVE (*)    All other components within normal limits  LIPASE, BLOOD  CBC  HCG, QUANTITATIVE, PREGNANCY  POC URINE PREG, ED   ____________________________________________  EKG  None ____________________________________________  RADIOLOGY  Ultrasound pending ____________________________________________   PROCEDURES  Procedure(s) performed: No  Procedures   Critical Care performed:No ____________________________________________   INITIAL IMPRESSION / ASSESSMENT AND PLAN / ED COURSE  Pertinent labs & imaging results that were available during my care of the patient were reviewed by me and considered in my medical decision making (see chart for details).  Patient unaware that she was pregnant.  Reports normal period On November 17 which would make this a very early pregnancy  We will check labs, obtain beta-hCG and consider ultrasound if necessary abdominal exam is very reassuring  I have asked Dr. Pershing ProudSchaevitz to follow-up on ultrasound and beta and disposition as appropriate    ____________________________________________   FINAL CLINICAL IMPRESSION(S) / ED DIAGNOSES  Final diagnoses:  Pregnancy, unspecified gestational age        Note:  This document was prepared using Dragon voice recognition software and may include unintentional dictation errors.    Jene EveryKinner, Vonzell Lindblad, MD 04/13/17 [phone]

## 2017-04-14 ENCOUNTER — Emergency Department
Admission: EM | Admit: 2017-04-14 | Discharge: 2017-04-14 | Disposition: A | Discharge status: Home / Self Care | Payer: Medicaid Other | Attending: Student in an Organized Health Care Education/Training Program | Admitting: Student in an Organized Health Care Education/Training Program

## 2017-04-14 ENCOUNTER — Encounter: Payer: Self-pay | Admitting: *Deleted

## 2017-04-14 ENCOUNTER — Other Ambulatory Visit: Payer: Self-pay

## 2017-04-14 DIAGNOSIS — O4690 Antepartum hemorrhage, unspecified, unspecified trimester: Secondary | ICD-10-CM

## 2017-04-14 DIAGNOSIS — O209 Hemorrhage in early pregnancy, unspecified: Secondary | ICD-10-CM | POA: Diagnosis present

## 2017-04-14 DIAGNOSIS — Z3491 Encounter for supervision of normal pregnancy, unspecified, first trimester: Secondary | ICD-10-CM | POA: Insufficient documentation

## 2017-04-14 DIAGNOSIS — Z79899 Other long term (current) drug therapy: Secondary | ICD-10-CM | POA: Diagnosis not present

## 2017-04-14 DIAGNOSIS — Z3A00 Weeks of gestation of pregnancy not specified: Secondary | ICD-10-CM | POA: Insufficient documentation

## 2017-04-14 LAB — HCG, QUANTITATIVE, PREGNANCY: HCG, BETA CHAIN, QUANT, S: 174 m[IU]/mL — AB (ref <5)

## 2017-04-14 NOTE — ED Provider Notes (Signed)
[facility] Emergency Department Provider Note    First MD Initiated Contact with Patient 04/14/17 1711     (approximate)  I have reviewed the triage vital signs and the nursing notes.   HISTORY  Chief Complaint Vaginal Bleeding    HPI Ebony [name] is a 21 y.o. female with new diagnosis of early pregnancy yesterday after having vaginal cramping Ri presents today with vaginal spotting that she noted earlier this morning.  Denies any cramping.  She did have ultrasound yesterday.  Denies any trauma.  No family history of bleeding disorders.  No dysuria.  Patient has follow-up with OB/GYN.  Patient came back today because she was told to return if she noticed any bleeding.  Denies any discomfort.  States is very small amount of blood on the toilet paper tissue.  Denies any clots.  Denies any pain or discomfort today.  Past Medical History:  Diagnosis Date  . Acne   . Eczema, allergic   . History of tonsillectomy    Family History  Problem Relation Age of Onset  . Diabetes Brother    Past Surgical History:  Procedure Laterality Date  . ADENOIDECTOMY    . TONSILLECTOMY    . WISDOM TOOTH EXTRACTION     Patient Active Problem List   Diagnosis Date Noted  . Obesity, pediatric, BMI 95th to 98th percentile for age 48/15/2016  . Intermenstrual bleeding 12/19/2014  . Allergic contact dermatitis 12/17/2014      Prior to Admission medications   Medication Sig Start Date End Date Taking? Authorizing Provider  etonogestrel (NEXPLANON) 68 MG IMPL implant Inject into the skin.    [provider]    Allergies Patient has no known allergies.    Social History Social History   Tobacco Use  . Smoking status: Never Smoker  . Smokeless tobacco: Never Used  Substance Use Topics  . Alcohol use: No    Alcohol/week: 0.0 oz  . Drug use: Yes    Frequency: 2.0 times per week    Types: Marijuana    Review of Systems Patient denies headaches,  rhinorrhea, blurry vision, numbness, shortness of breath, chest pain, edema, cough, abdominal pain, nausea, vomiting, diarrhea, dysuria, fevers, rashes or hallucinations unless otherwise stated above in HPI. ____________________________________________   PHYSICAL EXAM:  VITAL SIGNS: Vitals:   04/14/17 1606  BP: 122/68  Pulse: 71  Resp: 20  Temp: 98.5 F (36.9 C)  SpO2: 99%    Constitutional: Alert and oriented. Well appearing and in no acute distress. Eyes: Conjunctivae are normal.  Head: Atraumatic. Nose: No congestion/rhinnorhea. Mouth/Throat: Mucous membranes are moist.   Neck: Painless ROM.  Cardiovascular:   Good peripheral circulation. Respiratory: Normal respiratory effort.  No retractions.  Gastrointestinal: Soft and nontender.  GU: Normal-appearing cervix.  No cysts cervical motion tenderness.  No bleeding.  Cervical ossea is closed. No adnexal ttp Musculoskeletal: No lower extremity tenderness .  No joint effusions. Neurologic:  Normal speech and language. No gross focal neurologic deficits are appreciated.  Skin:  Skin is warm, dry and intact. No rash noted. Psychiatric: Mood and affect are normal. Speech and behavior are normal.  ____________________________________________   LABS (all labs ordered are listed, but only abnormal results are displayed)  Results for orders placed or performed during the hospital encounter of 04/14/17 (from the past 24 hour(s))  hCG, quantitative, pregnancy     Status: Abnormal   Collection Time: 04/14/17  5:34 PM  Result Value Ref Range  hCG, Beta Chain, Quant, S 174 (H) <5 mIU/mL   ____________________________________________ ____________________________________________  RADIOLOGY   ____________________________________________   PROCEDURES  Procedure(s) performed:  Procedures    Critical Care performed: no ____________________________________________   INITIAL IMPRESSION / ASSESSMENT AND PLAN / ED  COURSE  Pertinent labs & imaging results that were available during my care of the patient were reviewed by me and considered in my medical decision making (see chart for details).  DDX: ectopic, threatened AB, pid, subchorionic hematoma  Ebony [name] is a 21 y.o. who presents to the ED with vaginal bleeding as noted above.  Patient had ultrasound yesterday.  Patient is an very early gestation.  No signs or symptoms to suggest ectopic pregnancy at this time.  Her hCG has appropriately doubled.  No abdominal pain at this time.  No evidence of active bleeding.  At this point unlikely to have any utility in repeat ultrasound at this moment.  Patient is able to ambulate with steady gait is otherwise hemodynamically stable and comfortable appearing.  She is stable and appropriate for follow-up with OB/GYN.  Discussed strict return precautions.      ____________________________________________   FINAL CLINICAL IMPRESSION(S) / ED DIAGNOSES  Final diagnoses:  Vaginal bleeding in pregnancy      NEW MEDICATIONS STARTED DURING THIS VISIT:  This SmartLink is deprecated. Use AVSMEDLIST instead to display the medication list for a patient.   Note:  This document was prepared using Dragon voice recognition software and may include unintentional dictation errors.     Willy Eddyobinson, Sagan Maselli, MD 04/14/17 [phone]

## 2017-04-14 NOTE — Discharge Instructions (Signed)
Follow up with OB gyn ASAP.  Return for any additional concerns or questions.

## 2017-04-14 NOTE — ED Triage Notes (Signed)
Pt states she found out yesterday that she was pregnant. Pt states today she had some "light spotting" prior to arrival. The spotting is not enough that pt is wearing a pad at this time. One occurrence of spotting. Pt denies pain or other sxs.

## 2017-04-15 ENCOUNTER — Encounter: Payer: Self-pay | Admitting: Obstetrics and Gynecology

## 2017-04-15 ENCOUNTER — Ambulatory Visit (INDEPENDENT_AMBULATORY_CARE_PROVIDER_SITE_OTHER): Payer: Medicaid Other | Admitting: Obstetrics and Gynecology

## 2017-04-15 VITALS — BP 122/70 | Ht 66.0 in | Wt 156.0 lb

## 2017-04-15 DIAGNOSIS — O3680X0 Pregnancy with inconclusive fetal viability, not applicable or unspecified: Secondary | ICD-10-CM

## 2017-04-15 DIAGNOSIS — Z3201 Encounter for pregnancy test, result positive: Secondary | ICD-10-CM | POA: Diagnosis not present

## 2017-04-15 DIAGNOSIS — Z3490 Encounter for supervision of normal pregnancy, unspecified, unspecified trimester: Secondary | ICD-10-CM

## 2017-04-15 NOTE — Progress Notes (Signed)
Obstetrics & Gynecology Office Visit   Chief Complaint:  Chief Complaint  Patient presents with  . Follow-up    ER Follow up    History of Present Illness: 21 year old G2P1001 at 2713w3d by stated LMP who was seen in ED on 04/13/17 and 04/14/17 for some abdominal cramping, no vaginal bleeding and not feeling right.  HCG obtained 87 on 04/13/17 with rise to 174 on 04/14/17.  Ultrasound obtained 04/14/17 showing no pregnancy as would be expected given HCG levels.  She has not had any further cramping.  No nausea.    The patient has no identifiable risk factors for ectopic pregnancy (history of prior ectopic pregnancy, history of adnexal surgery, history of endometriosis, history of GC/CT, or PID).  This is an unplanned but desired pregnancy.    Review of Systems: Review of Systems  Constitutional: Negative for chills, fever and weight loss.  Gastrointestinal: Negative for abdominal pain, constipation, nausea and vomiting.  Genitourinary: Positive for frequency. Negative for dysuria and urgency.  Neurological: Negative for dizziness.  Psychiatric/Behavioral: Negative for depression and substance abuse.     Past Medical History:  Past Medical History:  Diagnosis Date  . Acne   . Eczema, allergic   . History of tonsillectomy     Past Surgical History:  Past Surgical History:  Procedure Laterality Date  . ADENOIDECTOMY    . TONSILLECTOMY    . WISDOM TOOTH EXTRACTION      Gynecologic History: Patient's last menstrual period was 03/22/2017 (approximate).  Obstetric History: G2P1001  Family History:  Family History  Problem Relation Age of Onset  . Diabetes Brother     Social History:  Social History   Socioeconomic History  . Marital status: Single    Spouse name: Not on file  . Number of children: Not on file  . Years of education: Not on file  . Highest education level: Not on file  Social Needs  . Financial resource strain: Not on file  . Food insecurity -  worry: Not on file  . Food insecurity - inability: Not on file  . Transportation needs - medical: Not on file  . Transportation needs - non-medical: Not on file  Occupational History  . Not on file  Tobacco Use  . Smoking status: Never Smoker  . Smokeless tobacco: Never Used  Substance and Sexual Activity  . Alcohol use: No    Alcohol/week: 0.0 oz  . Drug use: Yes    Frequency: 2.0 times per week    Types: Marijuana  . Sexual activity: Yes    Birth control/protection: Implant  Other Topics Concern  . Not on file  Social History Narrative  . Not on file    Allergies:  No Known Allergies  Medications: Prior to Admission medications   Medication Sig Start Date End Date Taking? Authorizing Provider  etonogestrel (NEXPLANON) 68 MG IMPL implant Inject into the skin.    [provider]    Physical Exam Vitals:  Vitals:   04/15/17 1328  BP: 122/70   Patient's last menstrual period was 03/22/2017 (approximate).  General: NAD HEENT: normocephalic, anicteric Thyroid: no enlargement, no palpable nodules Pulmonary: No increased work of breathing Extremities: no edema, erythema, or tenderness Neurologic: Grossly intact Psychiatric: mood appropriate, affect full  Results for orders placed or performed during the hospital encounter of 04/14/17 (from the past 72 hour(s))  hCG, quantitative, pregnancy     Status: Abnormal   Collection Time: 04/14/17  5:34 PM  Result Value Ref Range   hCG, Beta Chain, Quant, S 174 (H) <5 mIU/mL    Comment:          GEST. AGE      CONC.  (mIU/mL)   <=1 WEEK        5 - 50     2 WEEKS       50 - 500     3 WEEKS       100 - 10,000     4 WEEKS     1,000 - 30,000     5 WEEKS     3,500 - 115,000   6-8 WEEKS     12,000 - 270,000    12 WEEKS     15,000 - 220,000        FEMALE AND NON-PREGNANT FEMALE:     LESS THAN 5 mIU/mL   [place] Ob Comp Less 14 Wks  Result Date: 04/13/2017 CLINICAL DATA:  Positive pregnancy test. Lower abdominal pain for  3 days. EXAM: OBSTETRIC <14 WK US AND TRANSVAGINAL OB US TECHNIQUE: Both transabdominal and transvaginal ultrasound examinations were performed for complete evaluation of the gestation as well as the maternal uterus, adnexal regions, and pelvic cul-de-sac. Transvaginal technique was performed to assess early pregnancy. COMPARISON:  None. FINDINGS: Intrauterine gestational sac: None visualized. Yolk sac:  None visualized Embryo:  None visualized. Maternal uterus/adnexae: The uterus is otherwise unremarkable. A complex cystic lesion in the left ovary measures 2.4 x 2.2 x 1.7 cm. The right ovary is heterogeneous without a definite lesion. IMPRESSION: 1. No intrauterine pregnancy identified. 2. Cystic area in the left ovary measuring up to 2.4 cm likely represents a corpus luteal cyst. Electronically Signed   By: Marin Robertshristopher  Mattern M.D.   On: 04/13/2017 16:14   [place] Ob Transvaginal  Result Date: 04/13/2017 CLINICAL DATA:  Positive pregnancy test. Lower abdominal pain for 3 days. EXAM: OBSTETRIC <14 WK US AND TRANSVAGINAL OB US TECHNIQUE: Both transabdominal and transvaginal ultrasound examinations were performed for complete evaluation of the gestation as well as the maternal uterus, adnexal regions, and pelvic cul-de-sac. Transvaginal technique was performed to assess early pregnancy. COMPARISON:  None. FINDINGS: Intrauterine gestational sac: None visualized. Yolk sac:  None visualized Embryo:  None visualized. Maternal uterus/adnexae: The uterus is otherwise unremarkable. A complex cystic lesion in the left ovary measures 2.4 x 2.2 x 1.7 cm. The right ovary is heterogeneous without a definite lesion. IMPRESSION: 1. No intrauterine pregnancy identified. 2. Cystic area in the left ovary measuring up to 2.4 cm likely represents a corpus luteal cyst. Electronically Signed   By: Marin Robertshristopher  Mattern M.D.   On: 04/13/2017 16:14    Assessment: 21 y.o. G2P1001 ER follow pregnancy of unknown location, favor to early to  date IUP based on HCG trend  Plan: Problem List Items Addressed This Visit    None    Visit Diagnoses    Pregnancy of unknown anatomic location    -  Primary   Relevant Orders   Beta HCG, Quant   US OB Comp Less 14 Wks   Positive blood pregnancy test       Relevant Orders   Beta HCG, Quant   US OB Comp Less 14 Wks      The patient does not have identifiable risk factors which would increase her risk of ectopic pregnancy (Approximately 50% of all patient presenting with ectopic pregnancy will have identifiable risk factors such as prior GC/CT, PID, or ART).  The discriminatory  zone for visualizing a singleton intrauterine pregnancy is an HCG level of approximately 158mIU/mL to 2587mIU/mL, although the recommended value to rule out an intrauterine pregnancy has been set conservatively high at 3560mIU/mL to account for twin gestations which occur at a rate of approximately 2% of all spontaneous conceptions.  Serial HCG measurements are more helpful in determining the patients risk of ectopic than a single value.  Demonstration of adnexal mass with a hypoechoic center on ultrasound has a positive predictive value of approximately 80% and may misidentify corpus luteum cysts, paratubal cysts, and or hydrosalpinx among others.  An intrauterine gestational sac makes ectopic pregnancy less likely but may represent pseudosac.  The diagnosis of intrauterine pregnancy requires documentation of an intrauterine gestational sac with accompanying yolk sac or fetal pole.  Heterotopic pregnancy is exceedingly rare but may still be present in cases of confirmed intrauterine pregnancy with a reported incidence of 1 in 4,000 to 1 in 30,000.   Ruptured ectopic pregnancy continued to account for 2.7% of all maternal deaths, and the incidence of ectopic pregnancy is reported at approximately 2% of all spontaneous conceptions.  The patient understand the importance of returning for repeat HCG measurement in 48-hrs, as  well as follow up ultrasound.  She is currently hemodynamically stable, with a non-acute pelvic and abdominal exam, and reliable.  This is a desired pregnancy.  She was given strict precautions should she develop acute abdominal pain and or orthostatic symptoms.  ACOG Practice Bulletin 191 February 2018 "Ectopic Pregnancy"  - LMP 03/22/17 EGA [redacted]w[redacted]d HCG rising 87 on 04/13/17 with rise to 174 on 04/14/17 - G2P1001 prior pregnancy uncomplicated - No history of STI (GC/CT/PID), endometriosis, prior tubal or pelvic surgery, or ectopic - repeat ultrasound 1 week if doubling appropriately should be above discrimination zone - Given samples of prenatal vitamins discussed importance of folic acid in preventing neural tube defects - A total of 20 minutes were spent in face-to-face contact with the patient during this encounter with over half of that time devoted to counseling and coordination of care.

## 2017-04-16 LAB — BETA HCG QUANT (REF LAB): HCG QUANT: 236 m[IU]/mL

## 2017-04-24 ENCOUNTER — Ambulatory Visit (INDEPENDENT_AMBULATORY_CARE_PROVIDER_SITE_OTHER): Payer: Medicaid Other

## 2017-04-24 ENCOUNTER — Ambulatory Visit (INDEPENDENT_AMBULATORY_CARE_PROVIDER_SITE_OTHER): Payer: Medicaid Other | Admitting: Obstetrics and Gynecology

## 2017-04-24 ENCOUNTER — Encounter: Payer: Self-pay | Admitting: Obstetrics and Gynecology

## 2017-04-24 ENCOUNTER — Other Ambulatory Visit: Payer: Self-pay | Admitting: Obstetrics and Gynecology

## 2017-04-24 VITALS — BP 112/60 | HR 72 | Wt 158.0 lb

## 2017-04-24 DIAGNOSIS — O3680X0 Pregnancy with inconclusive fetal viability, not applicable or unspecified: Secondary | ICD-10-CM

## 2017-04-24 DIAGNOSIS — Z3201 Encounter for pregnancy test, result positive: Secondary | ICD-10-CM

## 2017-04-24 DIAGNOSIS — Z3490 Encounter for supervision of normal pregnancy, unspecified, unspecified trimester: Secondary | ICD-10-CM | POA: Diagnosis not present

## 2017-04-24 DIAGNOSIS — Z362 Encounter for other antenatal screening follow-up: Secondary | ICD-10-CM

## 2017-04-24 MED ORDER — PROMETHAZINE HCL 12.5 MG PO TABS: 12.5000 mg | ORAL_TABLET | Freq: Four times a day (QID) | ORAL | 0 refills | Status: DC | PRN

## 2017-04-24 NOTE — Progress Notes (Signed)
Dating scan today.  

## 2017-04-24 NOTE — Progress Notes (Signed)
Gynecology Ultrasound Follow Up  Chief Complaint:  Chief Complaint  Patient presents with  . NOB     History of Present Illness: Patient is a 21 y.o. female who presents today for ultrasound evaluation of for abdominal pain prompting ER visit with pregnancy of unknown location at that time  Ultrasound demonstrates the following findgins Adnexa: no masses seen  Uterus: Early gestational sac, fundal, normal shape, to early to date Additional: no free fluid  Review of Systems: Review of Systems  Constitutional: Positive for malaise/fatigue. Negative for weight loss.  Gastrointestinal: Positive for nausea. Negative for abdominal pain and vomiting.  Neurological: Negative for dizziness.    Past Medical History:  Past Medical History:  Diagnosis Date  . Acne   . Eczema, allergic   . History of tonsillectomy     Past Surgical History:  Past Surgical History:  Procedure Laterality Date  . ADENOIDECTOMY    . TONSILLECTOMY    . WISDOM TOOTH EXTRACTION      Gynecologic History:  Patient's last menstrual period was 03/22/2017 (approximate).  Family History:  Family History  Problem Relation Age of Onset  . Diabetes Brother     Social History:  Social History   Socioeconomic History  . Marital status: Single    Spouse name: Not on file  . Number of children: Not on file  . Years of education: Not on file  . Highest education level: Not on file  Social Needs  . Financial resource strain: Not on file  . Food insecurity - worry: Not on file  . Food insecurity - inability: Not on file  . Transportation needs - medical: Not on file  . Transportation needs - non-medical: Not on file  Occupational History  . Not on file  Tobacco Use  . Smoking status: Never Smoker  . Smokeless tobacco: Never Used  Substance and Sexual Activity  . Alcohol use: No    Alcohol/week: 0.0 oz  . Drug use: Yes    Frequency: 2.0 times per week    Types: Marijuana  . Sexual activity:  Yes    Birth control/protection: Implant  Other Topics Concern  . Not on file  Social History Narrative  . Not on file    Allergies:  No Known Allergies  Medications: Prior to Admission medications   Medication Sig Start Date End Date Taking? Authorizing Provider  promethazine (PHENERGAN) 12.5 MG tablet Take 1 tablet (12.5 mg total) by mouth every 6 (six) hours as needed for nausea or vomiting. 04/24/17   Vena AustriaStaebler, Ziara Thelander, MD    Physical Exam Vitals: Blood pressure 112/60, pulse 72, weight 158 lb (71.7 kg), last menstrual period 03/22/2017.  General: NAD HEENT: normocephalic, anicteric Pulmonary: No increased work of breathing Extremities: no edema, erythema, or tenderness Neurologic: Grossly intact, normal gait Psychiatric: mood appropriate, affect full   Assessment: 21 y.o. G2P1001 follow up ultrasound pregnancy of unknown lcation  Plan: Problem List Items Addressed This Visit    None    Visit Diagnoses    Pregnancy with inconclusive fetal viability, single or unspecified fetus    -  Primary   Relevant Orders   US OB Comp Less 14 Wks      1) Ultrasound today shows early IUP, still to early to date, uncertain viability.  Based on current ultrasound findings recommend follow up ultrasound in 2 weeks.  Assuming viability will plan on NOB visit at that time.   "Society of Radiologyst in Ultrasound Guidelines for Transvaginal Ultrasonographic  Diagnosis of Early Pregnancy Loss" and adopted in ACOG Practice Bulletin Number 150, May 2015 (reaffirmed 2017) "Early Pregnancy Loss"  - CRL of 7mm or greater and absence of fetal heartbeat  - Mean sac diameter of 25mm or greater and no embryo  - Absence of embryo with a discernable heartbeat 2 week after initial ultrasound showing a gestational sac without a yolk sac  - Absence of embryo with a discernable heartbeat 11 days after initial ultrasound showing a gestational sac with  yolk sac present  2) Rx for phentermine prn  for nausea.  Discussed prenatal gummy's in the first trimester, taking PNV in the evening to help with nausea.  Recommend small frequent meals.   3) A total of 15 minutes were spent in face-to-face contact with the patient during this encounter with over half of that time devoted to counseling and coordination of care.

## 2017-05-08 ENCOUNTER — Encounter: Payer: Self-pay | Admitting: Obstetrics and Gynecology

## 2017-05-09 ENCOUNTER — Telehealth: Payer: Self-pay | Admitting: Obstetrics and Gynecology

## 2017-05-09 ENCOUNTER — Other Ambulatory Visit: Payer: Self-pay | Admitting: Obstetrics and Gynecology

## 2017-05-09 NOTE — Telephone Encounter (Signed)
[employer] Needing work letter faxed. [phone]

## 2017-05-13 ENCOUNTER — Ambulatory Visit (INDEPENDENT_AMBULATORY_CARE_PROVIDER_SITE_OTHER): Payer: Medicaid Other

## 2017-05-13 ENCOUNTER — Ambulatory Visit (INDEPENDENT_AMBULATORY_CARE_PROVIDER_SITE_OTHER): Payer: Medicaid Other | Admitting: Obstetrics & Gynecology

## 2017-05-13 VITALS — BP 102/60 | Wt 158.0 lb

## 2017-05-13 DIAGNOSIS — Z3A08 8 weeks gestation of pregnancy: Secondary | ICD-10-CM | POA: Diagnosis not present

## 2017-05-13 DIAGNOSIS — O3680X0 Pregnancy with inconclusive fetal viability, not applicable or unspecified: Secondary | ICD-10-CM | POA: Diagnosis not present

## 2017-05-13 DIAGNOSIS — O021 Missed abortion: Secondary | ICD-10-CM | POA: Diagnosis not present

## 2017-05-13 NOTE — Progress Notes (Signed)
HPI: Patient complains of nausea since dx of pregnancy.  Pregnancy is desired. Sexual Activity: single partner, contraception: none. Current symptoms also include: morning sickness. Last period was normal.  No pain or bleeding since last US.   Patient's last menstrual period was 03/22/2017 (approximate).   Ultrasound demonstrates gest sac 28 mm and no embyro or yolk sac These findings are similar to US 04/24/17 with smaller gest sac then.  PMHx: She  has a past medical history of Acne, Eczema, allergic, and History of tonsillectomy. Also,  has a past surgical history that includes Tonsillectomy; Adenoidectomy; and Wisdom tooth extraction., family history includes Diabetes in her brother.,  reports that  has never smoked. she has never used smokeless tobacco. She reports that she uses drugs. Drug: Marijuana. Frequency: 2.00 times per week. She reports that she does not drink alcohol.  She has a current medication list which includes the following prescription(s): promethazine. Also, has No Known Allergies.  Review of Systems  All other systems reviewed and are negative.  Objective: BP 102/60   Wt 158 lb (71.7 kg)   LMP 03/22/2017 (Approximate)   BMI 25.50 kg/m   Physical examination Constitutional NAD, Conversant  Skin No rashes, lesions or ulceration.   Extremities: Moves all appropriately.  Normal ROM for age. No lymphadenopathy.  Neuro: Grossly intact  Psych: Oriented to PPT.  Normal mood. Normal affect.   Review of ULTRASOUND.    I have personally reviewed images and [facility]    Plan of management to be discussed with patient.  Assessment:  [redacted] weeks gestation of pregnancy Missed abortion with fetal demise before 20 completed weeks of gestation  "Society of Radiologyst in Ultrasound Guidelines for Transvaginal Ultrasonographic Diagnosis of Early Pregnancy Loss" and adopted in ACOG Practice Bulletin Number 150, May 2015 (reaffirmed 2017)  "Early Pregnancy Loss" - Mean sac diameter of 25mm or greater and no embryo is highly prognostic for missed abortion  Counseled as such for risks of bleeding or pain         Condolences were offered to the patient and her family.  I stressed that while emotionally difficult, that this did not occur because of an actions or inactions by the patient.  Somewhere between 10-20% of identified first trimester pregnancies will unfortunately end in miscarriage.  Given this relatively high incidence rate, further diagnostic testing such as chromosome analysis is generally not clinically relevant nor recommended.  Although the chromosomal abnormalities have been implicated at rates as high as 70% in some studies, these are generally random and do not infer and increased risk of recurrence with subsequent pregnancies.  However, 3 or more consecutive first trimester losses are relatively uncommon, and these patient generally do benefit from additional work up to determine a potential modifiable etiology.              We briefly discussed management options including expectant management, medical management, and surgical management as well as their relative success rates and complications. Approximately 80% of first trimester miscarriages will pass successfully but may require a time frame of up to 8 weeks (ACOG Practice Bulletin 150 May 2015 "Early Pregnancy Loss").  Medical management has literature supporting its use up to 63 days or 7531w0d gestation and results in a passage rate of 84-85% Environmental education officer(ACOG Practice Bulletin 143 March 2014 "Medical Management of First-Trimester Abortion").  Dilation and curettage has the highest rate of uterine evacuation, but carries with is operative cost, surgical and anesthetic risk.  While these risk are relatively small they nevertheless include infection, bleeding, uterine perforation, formation of uterine synechia, and in rare cases death.              We discussed repeat ultrasound and or  trending HCG levels if the patient wishes to pursue these prior to making her decision.  Clinically I am confident of the diagnosis, but I do not want any doubts in the patient's mind regarding the plan of management she chooses to adopt. The patient elects to proceed with surgical management for her missed abortion. I have discussed with the patient the indications for the procedure. Included in the discussion were the options of therapy, as wall as their individual risks, benefits, and complications. Ample time was given to answer all questions.   While the incidence is low, the risks from this surgery include, but are not limited to, the risks of anesthesia, hemorrhage, infection, perforation, and injury to adjacent structures including bowel, bladder and blood vessels.  A total of 15 minutes were spent face-to-face with the patient during this encounter and over half of that time dealt with counseling and coordination of care.   Annamarie Major, MD, [facility], [facility] 05/13/2017  10:58 AM

## 2017-05-13 NOTE — Patient Instructions (Addendum)

## 2017-05-21 ENCOUNTER — Ambulatory Visit: Payer: Self-pay | Admitting: Obstetrics & Gynecology

## 2017-05-27 ENCOUNTER — Other Ambulatory Visit: Payer: Self-pay | Admitting: Obstetrics & Gynecology

## 2017-05-27 ENCOUNTER — Ambulatory Visit (INDEPENDENT_AMBULATORY_CARE_PROVIDER_SITE_OTHER): Payer: Medicaid Other | Admitting: Certified Nurse Midwife

## 2017-05-27 ENCOUNTER — Telehealth: Payer: Self-pay | Admitting: Obstetrics and Gynecology

## 2017-05-27 ENCOUNTER — Ambulatory Visit (INDEPENDENT_AMBULATORY_CARE_PROVIDER_SITE_OTHER): Payer: Medicaid Other

## 2017-05-27 VITALS — BP 108/58 | Wt 159.0 lb

## 2017-05-27 DIAGNOSIS — Z0189 Encounter for other specified special examinations: Secondary | ICD-10-CM | POA: Diagnosis not present

## 2017-05-27 DIAGNOSIS — Z3682 Encounter for antenatal screening for nuchal translucency: Secondary | ICD-10-CM

## 2017-05-27 DIAGNOSIS — O020 Blighted ovum and nonhydatidiform mole: Secondary | ICD-10-CM

## 2017-05-27 NOTE — Telephone Encounter (Signed)
Patient is aware of Pre-admit Testing phone interview tomorrow afternoon 1-5pm, and OR on 05/29/17.

## 2017-05-27 NOTE — Telephone Encounter (Signed)
-----   Message from Conard NovakStephen D Jackson, MD sent at 05/27/2017 12:11 PM EST ----- Regarding: schedule surgery Surgery Booking Request Patient Full Name:  Ebony [name]  MRN: [id number]  DOB: 07-22-1995  Surgeon: Thomasene MohairStephen Jackson, MD  Requested Surgery Date and Time: 05/29/2017 Primary Diagnosis AND Code: missed abortion Secondary Diagnosis and Code:  Surgical Procedure: suction, dilation and curettage L&D Notification: No Admission Status: same day surgery Length of Surgery: 30 minutes Special Case Needs: none H&P: done (date) Will do H&P on day of surgery, consents signed already Phone Interview???: yes Interpreter: Language:  Medical Clearance: none Special Scheduling Instructions: consents signed. Will do H&P day of surgery. Please let me know when she is scheduled so I can put in pre-op orders.

## 2017-05-27 NOTE — Progress Notes (Signed)
Pt reports no problems. F/u u/s today due to likely MAB.

## 2017-05-27 NOTE — Progress Notes (Signed)
Presents at [id number] for a follow up ultrasound. Her last ultrasound on 1/8 revealed an empty 28 mm gestational sac.  The gestational sac today is measuring 36mm, but no fetal pole or yolk sac seen Patient is now doing a little spotting. She has a blighted ovum or anembryonic pregnancy. She would like to schedule a D&C. [facility] Dr [facility] RosenthalJackson [facility] Dr Jean RosenthalJackson in to speak to patient and husband and get informed [facility]

## 2017-05-28 ENCOUNTER — Encounter
Admission: RE | Admit: 2017-05-28 | Discharge: 2017-05-28 | Disposition: A | Discharge status: Home / Self Care | Payer: Medicaid Other | Source: Ambulatory Visit | Attending: Obstetrics and Gynecology | Admitting: Obstetrics and Gynecology

## 2017-05-28 ENCOUNTER — Other Ambulatory Visit: Payer: Self-pay

## 2017-05-28 MED ORDER — DOXYCYCLINE HYCLATE 100 MG IV SOLR
200.0000 mg | INTRAVENOUS | Status: AC
  Administered 2017-05-29: 200 mg via INTRAVENOUS
  Filled 2017-05-28: qty 200

## 2017-05-28 NOTE — Patient Instructions (Signed)
  Your procedure is scheduled on: 05-29-17 Report to Same Day Surgery 2nd floor medical mall [facility](Medical Mall Entrance-take elevator on left to 2nd floor.  Check in with surgery information desk.) @ 9:30 AM PER PT   Remember: Instructions that are not followed completely may result in serious medical risk, up to and including death, or upon the discretion of your surgeon and anesthesiologist your surgery may need to be rescheduled.    _x___ 1. Do not eat food after midnight the night before your procedure. You may drink clear liquids up to 2 hours before you are scheduled to arrive at the hospital for your procedure.  Do not drink clear liquids within 2 hours of your scheduled arrival to the hospital.  Clear liquids include  --Water or Apple juice without pulp  --Clear carbohydrate beverage such as ClearFast or Gatorade  --Black Coffee or Clear Tea (No milk, no creamers, do not add anything to  the coffee or Tea Type 1 and type 2 diabetics should only drink water.  No gum chewing or hard candies.     __x__ 2. No Alcohol for 24 hours before or after surgery.   __x__3. No Smoking for 24 prior to surgery.   ____  4. Bring all medications with you on the day of surgery if instructed.    __x__ 5. Notify your doctor if there is any change in your medical condition     (cold, fever, infections).     Do not wear jewelry, make-up, hairpins, clips or nail polish.  Do not wear lotions, powders, or perfumes. You may wear deodorant.  Do not shave 48 hours prior to surgery. Men may shave face and neck.  Do not bring valuables to the hospital.    [facility] any belongings or valuables.               Contacts, dentures or bridgework may not be worn into surgery.  Leave your suitcase in the car. After surgery it may be brought to your room.  For patients admitted to the hospital, discharge time is determined by your treatment team.   Patients discharged the day of surgery will  not be allowed to drive home.  You will need someone to drive you home and stay with you the night of your procedure.    ____ Take anti-hypertensive listed below, cardiac, seizure, asthma,     anti-reflux and psychiatric medicines. These include:  1. NONE  2.  3.  4.  5.  6.  ____Fleets enema or Magnesium Citrate as directed.   ____ Use CHG Soap or sage wipes as directed on instruction sheet   ____ Use inhalers on the day of surgery and bring to hospital day of surgery  ____ Stop Metformin and Janumet 2 days prior to surgery.    ____ Take 1/2 of usual insulin dose the night before surgery and none on the morning     surgery.   ____ Follow recommendations from Cardiologist, Pulmonologist or PCP regarding          stopping Aspirin, Coumadin, Plavix ,Eliquis, Effient, or Pradaxa, and Pletal.  ____Stop Anti-inflammatories such as Advil, Aleve, Ibuprofen, Motrin, Naproxen, Naprosyn, Goodies powders or aspirin products. OK to take Tylenol   ____ Stop supplements until after surgery.   ____ Bring C-Pap to the hospital.

## 2017-05-29 ENCOUNTER — Other Ambulatory Visit: Payer: Self-pay

## 2017-05-29 ENCOUNTER — Encounter
Admission: RE | Disposition: A | Discharge status: Home / Self Care | Payer: Self-pay | Source: Ambulatory Visit | Attending: Obstetrics and Gynecology

## 2017-05-29 ENCOUNTER — Ambulatory Visit: Payer: Medicaid Other | Admitting: Anesthesiology

## 2017-05-29 ENCOUNTER — Ambulatory Visit
Admission: RE | Admit: 2017-05-29 | Discharge: 2017-05-29 | Disposition: A | Discharge status: Home / Self Care | Payer: Medicaid Other | Source: Ambulatory Visit | Attending: Obstetrics and Gynecology | Admitting: Obstetrics and Gynecology

## 2017-05-29 DIAGNOSIS — L709 Acne, unspecified: Secondary | ICD-10-CM | POA: Insufficient documentation

## 2017-05-29 DIAGNOSIS — Z833 Family history of diabetes mellitus: Secondary | ICD-10-CM | POA: Insufficient documentation

## 2017-05-29 DIAGNOSIS — Z87891 Personal history of nicotine dependence: Secondary | ICD-10-CM | POA: Insufficient documentation

## 2017-05-29 DIAGNOSIS — O021 Missed abortion: Secondary | ICD-10-CM | POA: Diagnosis not present

## 2017-05-29 DIAGNOSIS — L308 Other specified dermatitis: Secondary | ICD-10-CM | POA: Diagnosis not present

## 2017-05-29 DIAGNOSIS — Z79899 Other long term (current) drug therapy: Secondary | ICD-10-CM | POA: Insufficient documentation

## 2017-05-29 DIAGNOSIS — O020 Blighted ovum and nonhydatidiform mole: Secondary | ICD-10-CM | POA: Diagnosis present

## 2017-05-29 HISTORY — PX: DILATION AND EVACUATION: SHX1459

## 2017-05-29 LAB — URINE DRUG SCREEN, QUALITATIVE (ARMC ONLY)
AMPHETAMINES, UR SCREEN: NOT DETECTED
Barbiturates, Ur Screen: NOT DETECTED
Benzodiazepine, Ur Scrn: NOT DETECTED
CANNABINOID 50 NG, UR ~~LOC~~: NOT DETECTED
COCAINE METABOLITE, UR ~~LOC~~: NOT DETECTED
MDMA (ECSTASY) UR SCREEN: NOT DETECTED
Methadone Scn, Ur: NOT DETECTED
Opiate, Ur Screen: NOT DETECTED
Phencyclidine (PCP) Ur S: NOT DETECTED
TRICYCLIC, UR SCREEN: NOT DETECTED

## 2017-05-29 LAB — TYPE AND SCREEN
ABO/RH(D): A POS
ANTIBODY SCREEN: NEGATIVE

## 2017-05-29 SURGERY — DILATION AND EVACUATION, UTERUS
Anesthesia: General

## 2017-05-29 MED ORDER — FENTANYL CITRATE (PF) 100 MCG/2ML IJ SOLN
INTRAMUSCULAR | Status: DC | PRN
  Administered 2017-05-29 (×2): 25 ug via INTRAVENOUS

## 2017-05-29 MED ORDER — PHENYLEPHRINE HCL 10 MG/ML IJ SOLN
INTRAMUSCULAR | Status: DC | PRN
  Administered 2017-05-29: 50 ug via INTRAVENOUS
  Administered 2017-05-29: 100 ug via INTRAVENOUS

## 2017-05-29 MED ORDER — ONDANSETRON HCL 4 MG/2ML IJ SOLN
INTRAMUSCULAR | Status: AC
  Filled 2017-05-29: qty 2

## 2017-05-29 MED ORDER — FENTANYL CITRATE (PF) 100 MCG/2ML IJ SOLN
INTRAMUSCULAR | Status: AC
  Filled 2017-05-29: qty 2

## 2017-05-29 MED ORDER — DEXAMETHASONE SODIUM PHOSPHATE 10 MG/ML IJ SOLN
INTRAMUSCULAR | Status: AC
  Filled 2017-05-29: qty 1

## 2017-05-29 MED ORDER — PROPOFOL 10 MG/ML IV BOLUS
INTRAVENOUS | Status: DC | PRN
  Administered 2017-05-29: 170 mg via INTRAVENOUS

## 2017-05-29 MED ORDER — IBUPROFEN 600 MG PO TABS: 600.0000 mg | ORAL_TABLET | Freq: Four times a day (QID) | ORAL | 0 refills | Status: DC | PRN

## 2017-05-29 MED ORDER — OXYCODONE HCL 5 MG PO TABS
5.0000 mg | ORAL_TABLET | Freq: Once | ORAL | Status: AC | PRN
  Administered 2017-05-29: 5 mg via ORAL

## 2017-05-29 MED ORDER — PROPOFOL 10 MG/ML IV BOLUS
INTRAVENOUS | Status: AC
  Filled 2017-05-29: qty 20

## 2017-05-29 MED ORDER — LACTATED RINGERS IV SOLN: INTRAVENOUS | Status: DC

## 2017-05-29 MED ORDER — METHYLERGONOVINE MALEATE 0.2 MG/ML IJ SOLN
INTRAMUSCULAR | Status: AC
  Filled 2017-05-29: qty 1

## 2017-05-29 MED ORDER — DEXAMETHASONE SODIUM PHOSPHATE 10 MG/ML IJ SOLN
INTRAMUSCULAR | Status: DC | PRN
  Administered 2017-05-29: 10 mg via INTRAVENOUS

## 2017-05-29 MED ORDER — HYDROCODONE-ACETAMINOPHEN 5-325 MG PO TABS: 1.0000 | ORAL_TABLET | Freq: Four times a day (QID) | ORAL | 0 refills | Status: DC | PRN

## 2017-05-29 MED ORDER — OXYCODONE HCL 5 MG PO TABS
ORAL_TABLET | ORAL | Status: AC
  Filled 2017-05-29: qty 1

## 2017-05-29 MED ORDER — FAMOTIDINE 20 MG PO TABS
20.0000 mg | ORAL_TABLET | Freq: Once | ORAL | Status: AC
  Administered 2017-05-29: 20 mg via ORAL

## 2017-05-29 MED ORDER — METHYLERGONOVINE MALEATE 0.2 MG/ML IJ SOLN
INTRAMUSCULAR | Status: DC | PRN
  Administered 2017-05-29: 0.2 mg via INTRAMUSCULAR

## 2017-05-29 MED ORDER — ONDANSETRON HCL 4 MG/2ML IJ SOLN
INTRAMUSCULAR | Status: DC | PRN
Start: 2017-05-29 — End: 2017-05-29
  Administered 2017-05-29: 4 mg via INTRAVENOUS

## 2017-05-29 MED ORDER — DOXYCYCLINE HYCLATE 100 MG PO CAPS: 100.0000 mg | ORAL_CAPSULE | Freq: Once | ORAL | 0 refills | Status: AC

## 2017-05-29 MED ORDER — MIDAZOLAM HCL 2 MG/2ML IJ SOLN
INTRAMUSCULAR | Status: DC | PRN
  Administered 2017-05-29: 2 mg via INTRAVENOUS

## 2017-05-29 MED ORDER — FAMOTIDINE 20 MG PO TABS
ORAL_TABLET | ORAL | Status: AC
  Administered 2017-05-29: 20 mg via ORAL
  Filled 2017-05-29: qty 1

## 2017-05-29 MED ORDER — MEPERIDINE HCL 50 MG/ML IJ SOLN: 6.2500 mg | INTRAMUSCULAR | Status: DC | PRN

## 2017-05-29 MED ORDER — ACETAMINOPHEN 10 MG/ML IV SOLN
INTRAVENOUS | Status: DC | PRN
  Administered 2017-05-29: 1000 mg via INTRAVENOUS

## 2017-05-29 MED ORDER — LACTATED RINGERS IV SOLN
INTRAVENOUS | Status: DC
  Administered 2017-05-29: 10:00:00 via INTRAVENOUS

## 2017-05-29 MED ORDER — MIDAZOLAM HCL 2 MG/2ML IJ SOLN
INTRAMUSCULAR | Status: AC
  Filled 2017-05-29: qty 2

## 2017-05-29 MED ORDER — FENTANYL CITRATE (PF) 100 MCG/2ML IJ SOLN: 25.0000 ug | INTRAMUSCULAR | Status: DC | PRN

## 2017-05-29 MED ORDER — ACETAMINOPHEN NICU IV SYRINGE 10 MG/ML
INTRAVENOUS | Status: AC
  Filled 2017-05-29: qty 1

## 2017-05-29 MED ORDER — OXYCODONE HCL 5 MG/5ML PO SOLN: 5.0000 mg | Freq: Once | ORAL | Status: AC | PRN

## 2017-05-29 MED ORDER — PROMETHAZINE HCL 25 MG/ML IJ SOLN: 6.2500 mg | INTRAMUSCULAR | Status: DC | PRN

## 2017-05-29 SURGICAL SUPPLY — 22 items
BAG URINE DRAINAGE (UROLOGICAL SUPPLIES) IMPLANT
CATH FOLEY 2WAY  5CC 16FR (CATHETERS)
CATH ROBINSON RED A/P 16FR (CATHETERS) ×2 IMPLANT
CATH URTH 16FR FL 2W BLN LF (CATHETERS) IMPLANT
FILTER UTR ASPR SPEC (MISCELLANEOUS) ×1 IMPLANT
FLTR UTR ASPR SPEC (MISCELLANEOUS) ×2
GLOVE BIO SURGEON STRL SZ7 (GLOVE) ×8 IMPLANT
GOWN STRL REUS W/ TWL LRG LVL3 (GOWN DISPOSABLE) ×2 IMPLANT
GOWN STRL REUS W/TWL LRG LVL3 (GOWN DISPOSABLE) ×2
KIT BERKELEY 1ST TRIMESTER 3/8 (MISCELLANEOUS) ×2 IMPLANT
KIT RM TURNOVER CYSTO AR (KITS) ×2 IMPLANT
NS IRRIG 500ML POUR BTL (IV SOLUTION) ×2 IMPLANT
PACK DNC HYST (MISCELLANEOUS) ×2 IMPLANT
PAD OB MATERNITY 4.3X12.25 (PERSONAL CARE ITEMS) ×2 IMPLANT
PAD PREP 24X41 OB/GYN DISP (PERSONAL CARE ITEMS) ×2 IMPLANT
SET BERKELEY SUCTION TUBING (SUCTIONS) ×2 IMPLANT
TOWEL OR 17X26 4PK STRL BLUE (TOWEL DISPOSABLE) ×2 IMPLANT
VACURETTE 10 RIGID CVD (CANNULA) IMPLANT
VACURETTE 12 RIGID CVD (CANNULA) IMPLANT
VACURETTE 7MM F TIP (CANNULA) ×1
VACURETTE 7MM F TIP STRL (CANNULA) ×1 IMPLANT
VACURETTE 8 RIGID CVD (CANNULA) IMPLANT

## 2017-05-29 NOTE — Anesthesia Preprocedure Evaluation (Signed)
Anesthesia Evaluation  Patient identified by MRN, date of birth, ID band Patient awake    Reviewed: Allergy & Precautions, NPO status , Patient's Chart, lab work & pertinent test results  History of Anesthesia Complications Negative for: history of anesthetic complications  Airway Mallampati: II  TM Distance: >3 FB Neck ROM: Full    Dental no notable dental hx.    Pulmonary neg sleep apnea, neg COPD, former smoker,    breath sounds clear to auscultation- rhonchi (-) wheezing      Cardiovascular Exercise Tolerance: Good (-) hypertension(-) CAD and (-) Past MI  Rhythm:Regular Rate:Normal - Systolic murmurs and - Diastolic murmurs    Neuro/Psych negative neurological ROS  negative psych ROS   GI/Hepatic negative GI ROS, Neg liver ROS,   Endo/Other  negative endocrine ROSneg diabetes  Renal/GU negative Renal ROS     Musculoskeletal negative musculoskeletal ROS (+)   Abdominal (+) - obese,   Peds  Hematology negative hematology ROS (+)   Anesthesia Other Findings   Reproductive/Obstetrics Missed Ab                             Anesthesia Physical Anesthesia Plan  ASA: I  Anesthesia Plan: General   Post-op Pain Management:    Induction: Intravenous  PONV Risk Score and Plan: 2 and Dexamethasone and Ondansetron  Airway Management Planned: LMA  Additional Equipment:   Intra-op Plan:   Post-operative Plan:   Informed Consent: I have reviewed the patients History and Physical, chart, labs and discussed the procedure including the risks, benefits and alternatives for the proposed anesthesia with the patient or authorized representative who has indicated his/her understanding and acceptance.   Dental advisory given  Plan Discussed with: CRNA and Anesthesiologist  Anesthesia Plan Comments:         Anesthesia Quick Evaluation

## 2017-05-29 NOTE — OR Nursing (Signed)
Both nipples pierced, patient states she is unable to remov, both tapped OR and surgeon aware.

## 2017-05-29 NOTE — Anesthesia Post-op Follow-up Note (Signed)
Anesthesia QCDR form completed.        

## 2017-05-29 NOTE — Anesthesia Postprocedure Evaluation (Signed)
Anesthesia Post Note  Patient: Ebony [name]  Procedure(s) Performed: DILATATION AND EVACUATION (N/A )  Patient location during evaluation: PACU Anesthesia Type: General Level of consciousness: awake and alert and oriented Pain management: pain level controlled Vital Signs Assessment: post-procedure vital signs reviewed and stable Respiratory status: spontaneous breathing, nonlabored ventilation and respiratory function stable Cardiovascular status: blood pressure returned to baseline and stable Postop Assessment: no signs of nausea or vomiting Anesthetic complications: no     Last Vitals:  Vitals:   05/29/17 1312 05/29/17 1325  BP: 101/64 107/74  Pulse: 64 (!) 58  Resp: (!) 21 18  Temp: 36.9 C (!) 36.3 C  SpO2: 100% 100%    Last Pain:  Vitals:   05/29/17 1325  TempSrc: Tympanic  PainSc: 5                  Tashan Kreitzer

## 2017-05-29 NOTE — Anesthesia Procedure Notes (Signed)
Procedure Name: LMA Insertion Date/Time: 05/29/2017 11:51 AM Performed by: Omer JackWeatherly, Westyn Keatley, CRNA Pre-anesthesia Checklist: Patient identified, Patient being monitored, Timeout performed, Emergency Drugs available and Suction available Patient Re-evaluated:Patient Re-evaluated prior to induction Oxygen Delivery Method: Circle system utilized Preoxygenation: Pre-oxygenation with 100% oxygen Induction Type: IV induction Ventilation: Mask ventilation without difficulty LMA: LMA inserted LMA Size: 3.5 Tube type: Oral Number of attempts: 1 Placement Confirmation: positive ETCO2 and breath sounds checked- equal and bilateral Tube secured with: Tape Dental Injury: Teeth and Oropharynx as per pre-operative assessment

## 2017-05-29 NOTE — H&P (Signed)
Preoperative History and Physical  Ebony [name] is a 22 y.o. G2P1001 here for surgical management of missed abortion.   No significant preoperative concerns.  History of Present Illness: 22 y.o. [id number] female with diagnosed missed abortion.  On 05/23/17 she had an ultrasound that showed a 28 mm gestational sac with no fetal pole. On 05/27/17 a repeat ultrasound showed a gestational sac measuring 36 mm with no fetal pole.  The patient meets criteria for failed pregnancy.  She had been doing a little spotting. She has been previously counseled regarding management options. He has elected to undergo surgical management.  Proposed surgery: suction, dilation and curettage.  Past Medical History:  Diagnosis Date  . Acne   . Eczema, allergic   . History of tonsillectomy    Past Surgical History:  Procedure Laterality Date  . ADENOIDECTOMY    . TONSILLECTOMY    . WISDOM TOOTH EXTRACTION     OB History  Gravida Para Term Preterm AB Living  2 1 1     1   SAB TAB Ectopic Multiple Live Births        0 1    # Outcome Date GA Lbr Len/2nd Weight Sex Delivery Anes PTL Lv  2 Current           1 Term 05/06/14 [id number] 10:00 / 00:28 7 lb 1.4 oz (3.215 kg) M Vag-Spont EPI  LIV     Birth Comments: none    Patient denies any other pertinent gynecologic issues.   No current facility-administered medications on file prior to encounter.    Current Outpatient Medications on File Prior to Encounter  Medication Sig Dispense Refill  . promethazine (PHENERGAN) 12.5 MG tablet Take 1 tablet (12.5 mg total) by mouth every 6 (six) hours as needed for nausea or vomiting. (Patient not taking: Reported on 05/27/2017) 30 tablet 0   Allergies: No Known Allergies  Social History:   reports that she quit smoking about 2 months ago. Her smoking use included cigarettes. She quit after 1.00 year of use. she has never used smokeless tobacco. She reports that she uses drugs. Drug: Marijuana. Frequency: 2.00 times per  week. She reports that she does not drink alcohol.  Family History  Problem Relation Age of Onset  . Diabetes Brother    Review of Systems  Constitutional: Negative.   HENT: Negative.   Eyes: Negative.   Respiratory: Negative.   Cardiovascular: Negative.   Gastrointestinal: Negative.   Genitourinary: Negative.        Unless noted in HPI  Musculoskeletal: Negative.   Skin: Negative.   Neurological: Negative.   Psychiatric/Behavioral: Negative.      PHYSICAL EXAM: Blood pressure 111/69, pulse 86, temperature 98.2 F (36.8 C), temperature source Temporal, resp. rate 16, height 5\' 6"  (1.676 m), weight 159 lb (72.1 kg), last menstrual period 03/22/2017, SpO2 100 %. Physical Exam  Constitutional: She is oriented to person, place, and time. She appears distressed.  HENT:  Head: Normocephalic and atraumatic.  Eyes: Conjunctivae are normal. No scleral icterus.  Neck: Normal range of motion. Neck supple.  Cardiovascular: Regular rhythm. Exam reveals no gallop and no friction rub.  No murmur heard. Pulmonary/Chest: Effort normal and breath sounds normal. No respiratory distress. She has no wheezes. She has no rales. She exhibits no tenderness.  Abdominal: Soft. Bowel sounds are normal. She exhibits no distension. There is no tenderness. There is no rebound.  Musculoskeletal: Normal range of motion. She exhibits no edema, tenderness or deformity.  Neurological: She is alert and oriented to person, place, and time. No cranial nerve deficit.  Skin: She is not diaphoretic.  Psychiatric: Mood, affect and judgment normal.    Labs: Type and screen [facility]   Collection Time: 05/29/17  9:26 AM  Result Value Ref Range   ABO/RH(D) A POS    Antibody Screen NEG    Sample Expiration      06/01/2017 Performed at [facility], [address] [place], [place] [address]    Assessment: Missed abortion with anembryonic pregnancy  Plan: Patient will undergo  surgical management with suction, dilation and curettage.   The risks of surgery were discussed in detail with the patient including but not limited to: bleeding which may require transfusion or reoperation; infection which may require antibiotics; injury to surrounding organs which may involve bowel, bladder, ureters ; need for additional procedures including laparoscopy or laparotomy; thromboembolic phenomenon, surgical site problems and other postoperative/anesthesia complications. Likelihood of success in alleviating the patient's condition was discussed. Routine postoperative instructions will be reviewed with the patient and her family in detail after surgery.  The patient concurred with the proposed plan, giving informed written consent for the surgery.  Patient has been NPO since last night she will remain NPO for procedure.  Anesthesia and OR aware.  Preoperative prophylactic antibiotics and SCDs ordered on call to the OR.  To OR when ready.  Thomasene Mohair, MD 05/29/2017 11:24 AM

## 2017-05-29 NOTE — Transfer of Care (Signed)
Immediate Anesthesia Transfer of Care Note  Patient: Ebony [name]  Procedure(s) Performed: DILATATION AND EVACUATION (N/A )  Patient Location: PACU  Anesthesia Type:General  Level of Consciousness: sedated  Airway & Oxygen Therapy: Patient Spontanous Breathing and Patient connected to face mask oxygen  Post-op Assessment: Report given to RN and Post -op Vital signs reviewed and stable  Post vital signs: Reviewed and stable  Last Vitals:  Vitals:   05/29/17 0919 05/29/17 1242  BP: 111/69 (!) (P) 114/58  Pulse: 86   Resp: 16   Temp: 36.8 C (!) (P) 36.2 C  SpO2: 100%     Last Pain:  Vitals:   05/29/17 0919  TempSrc: Temporal         Complications: No apparent anesthesia complications

## 2017-05-29 NOTE — Op Note (Signed)
  Operative Note    Pre-Op Diagnosis: missed spontaneous abortion  Post-Op Diagnosis: missed spontaneous abortion  Procedures: Suction, dilation and curettage  Primary Surgeon: Thomasene MohairStephen Omarr Hann, MD   EBL: 300 mL   IVF: 900 mL crystalloid  Urine output: 600 mL clear urine at beginning of case  Specimens: endometrial curettings, products of conception  Drains: none  Complications: None   Disposition: PACU   Condition: Stable   Findings: 8 week size uterus  Procedure Summary:   Procedure D&E After informed consent was confirmed, the patient was taken to the operating room where general anesthesia was induced.  Her legs were carefully placed in the candy cane stirrups.  She was prepped and draped in the standard fashion.  A speculum was placed in the vagina.   A tenaculum was placed on the anterior lip of the cervix.  The cervix was serially dilated to 8 mm.  The 8 mm flexible suction curette was advanced to the uterine fundus.  Several passes were made with the suction curette with evacuation of adequate tissue noted.  Three passes were made using sharp curettage until a gritty texture was noted.  One final pass was made with the suction curette.  All instruments were removed from the vagina and the cervix was noted to be hemostatic after removal of the tenaculum, with minimal-to-no bleeding from the cervix.  The vagina was verified to be free of any sponges or instruments.  The patient tolerated the procedure well.  Sponge, lap, needle, and instrument counts were correct x 2.  VTE prophylaxis: SCDs. Antibiotic prophylaxis: doxycycline 200mg  IV on call to OR. She was awakened in the operating room and was taken to the PACU in stable condition.   Thomasene MohairStephen Billiejo Sorto, MD 05/29/2017 12:27 PM

## 2017-05-29 NOTE — Discharge Instructions (Signed)

## 2017-05-30 LAB — SURGICAL PATHOLOGY

## 2017-06-02 ENCOUNTER — Encounter: Payer: Self-pay | Admitting: Emergency Medicine

## 2017-06-02 ENCOUNTER — Other Ambulatory Visit: Payer: Self-pay

## 2017-06-02 DIAGNOSIS — Z87891 Personal history of nicotine dependence: Secondary | ICD-10-CM | POA: Diagnosis not present

## 2017-06-02 DIAGNOSIS — R1030 Lower abdominal pain, unspecified: Secondary | ICD-10-CM | POA: Insufficient documentation

## 2017-06-02 DIAGNOSIS — R102 Pelvic and perineal pain: Secondary | ICD-10-CM | POA: Insufficient documentation

## 2017-06-02 LAB — COMPREHENSIVE METABOLIC PANEL
ALT: 16 U/L (ref 14–54)
AST: 19 U/L (ref 15–41)
Albumin: 3.8 g/dL (ref 3.5–5.0)
Alkaline Phosphatase: 44 U/L (ref 38–126)
Anion gap: 8 (ref 5–15)
BUN: 11 mg/dL (ref 6–20)
CHLORIDE: 105 mmol/L (ref 101–111)
CO2: 25 mmol/L (ref 22–32)
CREATININE: 0.56 mg/dL (ref 0.44–1.00)
Calcium: 9.2 mg/dL (ref 8.9–10.3)
GFR calc non Af Amer: >60 mL/min (ref >60)
Glucose, Bld: 91 mg/dL (ref 65–99)
POTASSIUM: 4.3 mmol/L (ref 3.5–5.1)
SODIUM: 138 mmol/L (ref 135–145)
Total Bilirubin: 0.4 mg/dL (ref 0.3–1.2)
Total Protein: 7.1 g/dL (ref 6.5–8.1)

## 2017-06-02 LAB — CBC
HEMATOCRIT: 35.9 % (ref 35.0–47.0)
Hemoglobin: 11.8 g/dL — ABNORMAL LOW (ref 12.0–16.0)
MCH: 30.4 pg (ref 26.0–34.0)
MCHC: 32.7 g/dL (ref 32.0–36.0)
MCV: 92.8 fL (ref 80.0–100.0)
Platelets: 221 10*3/uL (ref 150–440)
RBC: 3.87 MIL/uL (ref 3.80–5.20)
RDW: 12.9 % (ref 11.5–14.5)
WBC: 6.9 10*3/uL (ref 3.6–11.0)

## 2017-06-02 LAB — URINALYSIS, COMPLETE (UACMP) WITH MICROSCOPIC
BILIRUBIN URINE: NEGATIVE
Bacteria, UA: NONE SEEN
Glucose, UA: NEGATIVE mg/dL
KETONES UR: NEGATIVE mg/dL
Nitrite: NEGATIVE
PROTEIN: 30 mg/dL — AB
Specific Gravity, Urine: 1.024 (ref 1.005–1.030)
pH: 5 (ref 5.0–8.0)

## 2017-06-02 LAB — LIPASE, BLOOD: LIPASE: 23 U/L (ref 11–51)

## 2017-06-02 NOTE — ED Triage Notes (Signed)
Pt states she had a D and C on 1/24 by her obgyn and has been having abd and vaginal pain since the surgery after having a miscarriage. Pt states there had been no known complications. Denies n/v/d

## 2017-06-03 ENCOUNTER — Emergency Department: Payer: Medicaid Other

## 2017-06-03 ENCOUNTER — Emergency Department
Admission: EM | Admit: 2017-06-03 | Discharge: 2017-06-03 | Disposition: A | Discharge status: Home / Self Care | Payer: Medicaid Other | Attending: Emergency Medicine | Admitting: Emergency Medicine

## 2017-06-03 DIAGNOSIS — R1030 Lower abdominal pain, unspecified: Secondary | ICD-10-CM

## 2017-06-03 DIAGNOSIS — R102 Pelvic and perineal pain: Secondary | ICD-10-CM

## 2017-06-03 LAB — CHLAMYDIA/NGC RT PCR (ARMC ONLY)
Chlamydia Tr: NOT DETECTED
N gonorrhoeae: NOT DETECTED

## 2017-06-03 LAB — WET PREP, GENITAL
Clue Cells Wet Prep HPF POC: NONE SEEN
Sperm: NONE SEEN
TRICH WET PREP: NONE SEEN
YEAST WET PREP: NONE SEEN

## 2017-06-03 LAB — HCG, QUANTITATIVE, PREGNANCY: HCG, BETA CHAIN, QUANT, S: 1031 m[IU]/mL — AB (ref <5)

## 2017-06-03 LAB — PREGNANCY, URINE: Preg Test, Ur: POSITIVE — AB

## 2017-06-03 MED ORDER — KETOROLAC TROMETHAMINE 60 MG/2ML IM SOLN
60.0000 mg | Freq: Once | INTRAMUSCULAR | Status: AC
  Administered 2017-06-03: 60 mg via INTRAMUSCULAR
  Filled 2017-06-03: qty 2

## 2017-06-03 MED ORDER — DOXYCYCLINE HYCLATE 100 MG PO TABS
100.0000 mg | ORAL_TABLET | Freq: Once | ORAL | Status: AC
Start: 2017-06-03 — End: 2017-06-03
  Administered 2017-06-03: 100 mg via ORAL
  Filled 2017-06-03: qty 1

## 2017-06-03 MED ORDER — TRAMADOL HCL 50 MG PO TABS: 50.0000 mg | ORAL_TABLET | Freq: Four times a day (QID) | ORAL | 0 refills | Status: DC | PRN

## 2017-06-03 NOTE — Discharge Instructions (Signed)
Please follow-up in the office with Dr. Jean RosenthalJackson for further evaluation of your abdominal pain and vaginal pain.

## 2017-06-03 NOTE — ED Notes (Signed)

## 2017-06-03 NOTE — ED Provider Notes (Signed)
[facility] Emergency Department Provider Note   ____________________________________________   First MD Initiated Contact with Patient 06/03/17 0151     (approximate)  I have reviewed the triage vital signs and the nursing notes.   HISTORY  Chief Complaint Abdominal Pain and Post-op Problem    HPI Ebony [name] is a 22 y.o. female who comes into the hospital today with some abdominal pain and vaginal pain.  The patient had a D&C on January 24.  She reports that she has been having some lower abdominal pain and vaginal pain for the past 2 days.  She has been taking hydrocodone and ibuprofen.  It helps a little bit with the sharp pain but the pain comes back.  She states that the bleeding has been the same after surgery.  She rates her pain 8 out of 10 in intensity currently.  The patient denies any nausea or vomiting.  She denies any other discharge odor or fevers.  The patient is here today for evaluation.  Past Medical History:  Diagnosis Date  . Acne   . Eczema, allergic   . History of tonsillectomy     Patient Active Problem List   Diagnosis Date Noted  . Blighted ovum 05/27/2017  . Obesity, pediatric, BMI 95th to 98th percentile for age 71/15/2016  . Intermenstrual bleeding 12/19/2014  . Allergic contact dermatitis 12/17/2014    Past Surgical History:  Procedure Laterality Date  . ADENOIDECTOMY    . DILATION AND EVACUATION N/A 05/29/2017   Procedure: DILATATION AND EVACUATION;  Surgeon: Conard Novak, MD;  Location: [facility] ORS;  Service: Gynecology;  Laterality: N/A;  . TONSILLECTOMY    . WISDOM TOOTH EXTRACTION      Prior to Admission medications   Medication Sig Start Date End Date Taking? Authorizing Provider  HYDROcodone-acetaminophen (NORCO) 5-325 MG tablet Take 1 tablet by mouth every 6 (six) hours as needed (breakthrough pain). 05/29/17   Conard Novak, MD  ibuprofen (ADVIL,MOTRIN) 600 MG tablet Take 1 tablet (600 mg  total) by mouth every 6 (six) hours as needed for mild pain or cramping. 05/29/17   Conard Novak, MD  promethazine (PHENERGAN) 12.5 MG tablet Take 1 tablet (12.5 mg total) by mouth every 6 (six) hours as needed for nausea or vomiting. Patient not taking: Reported on 05/27/2017 04/24/17   Vena Austria, MD  traMADol (ULTRAM) 50 MG tablet Take 1 tablet (50 mg total) by mouth every 6 (six) hours as needed. 06/03/17   Rebecka Apley, MD    Allergies Patient has no known allergies.  Family History  Problem Relation Age of Onset  . Diabetes Brother     Social History Social History   Tobacco Use  . Smoking status: Former Smoker    Years: 1.00    Types: Cigarettes    Last attempt to quit: 03/28/2017    Years since quitting: 0.1  . Smokeless tobacco: Never Used  . Tobacco comment: 1-2 CIG /WEEK  Substance Use Topics  . Alcohol use: No    Alcohol/week: 0.0 oz  . Drug use: Yes    Frequency: 2.0 times per week    Types: Marijuana    Review of Systems  Constitutional: No fever/chills Eyes: No visual changes. ENT: No sore throat. Cardiovascular: Denies chest pain. Respiratory: Denies shortness of breath. Gastrointestinal: abdominal pain.  No nausea, no vomiting.  No diarrhea.  No constipation. Genitourinary: Vaginal pain Musculoskeletal: Negative for back pain. Skin: Negative for rash. Neurological: Negative  for headaches, focal weakness or numbness.   ____________________________________________   PHYSICAL EXAM:  VITAL SIGNS: ED Triage Vitals [06/02/17 2328]  Enc Vitals Group     BP 125/86     Pulse Rate 70     Resp 18     Temp 98.2 F (36.8 C)     Temp Source Oral     SpO2 100 %     Weight 159 lb (72.1 kg)     Height 5\' 6"  (1.676 m)     Head Circumference      Peak Flow      Pain Score 8     Pain Loc      Pain Edu?      Excl. in GC?     Constitutional: Alert and oriented. Well appearing and in moderate distress. Eyes: Conjunctivae are normal.  PERRL. EOMI. Head: Atraumatic. Nose: No congestion/rhinnorhea. Mouth/Throat: Mucous membranes are moist.  Oropharynx non-erythematous. Cardiovascular: Normal rate, regular rhythm. Grossly normal heart sounds.  Good peripheral circulation. Respiratory: Normal respiratory effort.  No retractions. Lungs CTAB. Gastrointestinal: Soft with some lower abd tenderness to palpation. No distention.  Genitourinary: Normal external genitalia with some scant blood in the vaginal vault some uterine tenderness to palpation.  No discharge  Musculoskeletal: No lower extremity tenderness nor edema.   Neurologic:  Normal speech and language.  Skin:  Skin is warm, dry and intact.  Psychiatric: Mood and affect are normal.   ____________________________________________   LABS (all labs ordered are listed, but only abnormal results are displayed)  Labs Reviewed  WET PREP, GENITAL - Abnormal; Notable for the following components:      Result Value   WBC, Wet Prep HPF POC FEW (*)    All other components within normal limits  CBC - Abnormal; Notable for the following components:   Hemoglobin 11.8 (*)    All other components within normal limits  URINALYSIS, COMPLETE (UACMP) WITH MICROSCOPIC - Abnormal; Notable for the following components:   Color, Urine YELLOW (*)    APPearance CLOUDY (*)    Hgb urine dipstick LARGE (*)    Protein, ur 30 (*)    Leukocytes, UA TRACE (*)    Squamous Epithelial / LPF 0-5 (*)    All other components within normal limits  PREGNANCY, URINE - Abnormal; Notable for the following components:   Preg Test, Ur POSITIVE (*)    All other components within normal limits  HCG, QUANTITATIVE, PREGNANCY - Abnormal; Notable for the following components:   hCG, Beta Chain, Quant, S 1,031 (*)    All other components within normal limits  CHLAMYDIA/NGC RT PCR ([facility] ONLY)  LIPASE, BLOOD  COMPREHENSIVE METABOLIC PANEL    ____________________________________________  EKG  none ____________________________________________  RADIOLOGY  US Pelvis: No intrauterine gestational sac, heterogeneous expansion of the endometrium at 2.5 cm diameter. Could represent blood clots or retained products of conception.  ____________________________________________   PROCEDURES  Procedure(s) performed: None  Procedures  Critical Care performed: No  ____________________________________________   INITIAL IMPRESSION / ASSESSMENT AND PLAN / ED COURSE  As part of my medical decision making, I reviewed the following data within the electronic MEDICAL RECORD NUMBER Notes from prior ED visits and  Controlled Substance Database   This is a 22 year old female who comes into the hospital today with some lower abdominal and vaginal pain after a D&C.  My differential diagnosis includes retained products of conception, endometritis,  We did check some blood work on the patient and the patient's white  blood cell count is unremarkable.  Her hemoglobin is 11.8 which is decreased from previous.  The patient's ultrasound shows some debris in the patient's endometrium concerning for possible retained products or clots.  I did contact OB/GYN for their guidance as to what would be appropriate and how to manage the patient's pain.  I did give the patient a shot of Toradol and some doxycycline.      Dr. Jerene Pitch who is the OB/GYN on-call did evaluate the ultrasound.  Again the patient is not having heavy bleeding at this time her pain is crampy and she does not have color flow to the products in the endometrial cavity.  Dr. Gaynelle Arabian feels that the patient can follow-up in the office with Dr. Jean Rosenthal.  She does not have any immediate recommendations for the patient is for her to follow-up.  The patient will be discharged to follow-up with her OB/GYN for further evaluation. ____________________________________________   FINAL CLINICAL  IMPRESSION(S) / ED DIAGNOSES  Final diagnoses:  Pelvic pain  Lower abdominal pain     ED Discharge Orders        Ordered    traMADol (ULTRAM) 50 MG tablet  Every 6 hours PRN     06/03/17 0530       Note:  This document was prepared using Dragon voice recognition software and may include unintentional dictation errors.    Rebecka Apley, MD 06/03/17 [id number]

## 2017-06-12 ENCOUNTER — Encounter: Payer: Self-pay | Admitting: Obstetrics and Gynecology

## 2017-06-12 ENCOUNTER — Ambulatory Visit (INDEPENDENT_AMBULATORY_CARE_PROVIDER_SITE_OTHER): Payer: Medicaid Other | Admitting: Obstetrics and Gynecology

## 2017-06-12 VITALS — BP 118/78 | Ht 66.0 in | Wt 160.0 lb

## 2017-06-12 DIAGNOSIS — O020 Blighted ovum and nonhydatidiform mole: Secondary | ICD-10-CM

## 2017-06-12 DIAGNOSIS — Z09 Encounter for follow-up examination after completed treatment for conditions other than malignant neoplasm: Secondary | ICD-10-CM

## 2017-06-12 DIAGNOSIS — O021 Missed abortion: Secondary | ICD-10-CM

## 2017-06-12 NOTE — Progress Notes (Signed)
   Postoperative Follow-up Patient presents post op from suction D&C  2weeks ago for missed abortion.  Subjective: Patient reports marked improvement in her preop symptoms. Eating a regular diet without difficulty. The patient is not having any pain.  Activity: normal activities of daily living.  Objective: Vitals:   06/12/17 0954  BP: 118/78   Vital Signs: BP 118/78   Ht 5\' 6"  (1.676 m)   Wt 160 lb (72.6 kg)   LMP 03/22/2017 (Approximate)   BMI 25.82 kg/m  Constitutional: Well nourished, well developed female in no acute distress.  HEENT: normal Skin: Warm and dry.  Extremity: no edema  Abdomen: Soft, non-tender, normal bowel sounds; no bruits, organomegaly or masses.  Assessment: 22 y.o. s/p suction, dilation and curettage doing well  Plan: Patient has done well after surgery with no apparent complications.  I have discussed the post-operative course to date, and the expected progress moving forward.  The patient understands what complications to be concerned about.  I will see the patient in routine follow up, or sooner if needed.    Activity plan: No restriction.  Discussed pregnancy planning. I would wait 1-2 cycles and then she may try again. Also, discussed contraception, if desired. She does not desire contraception at this time.  Thomasene MohairStephen Duante Arocho, MD 06/12/2017, 10:40 AM

## 2017-06-26 ENCOUNTER — Encounter: Payer: Self-pay | Admitting: Emergency Medicine

## 2017-06-26 ENCOUNTER — Other Ambulatory Visit: Payer: Self-pay

## 2017-06-26 ENCOUNTER — Emergency Department
Admission: EM | Admit: 2017-06-26 | Discharge: 2017-06-26 | Disposition: A | Discharge status: Home / Self Care | Payer: Medicaid Other | Attending: Emergency Medicine | Admitting: Emergency Medicine

## 2017-06-26 DIAGNOSIS — R112 Nausea with vomiting, unspecified: Secondary | ICD-10-CM | POA: Diagnosis present

## 2017-06-26 DIAGNOSIS — Z87891 Personal history of nicotine dependence: Secondary | ICD-10-CM | POA: Insufficient documentation

## 2017-06-26 DIAGNOSIS — A084 Viral intestinal infection, unspecified: Secondary | ICD-10-CM | POA: Insufficient documentation

## 2017-06-26 LAB — INFLUENZA PANEL BY PCR (TYPE A & B)
INFLAPCR: NEGATIVE
INFLBPCR: NEGATIVE

## 2017-06-26 MED ORDER — ONDANSETRON 4 MG PO TBDP: 4.0000 mg | ORAL_TABLET | Freq: Three times a day (TID) | ORAL | 0 refills | Status: DC | PRN

## 2017-06-26 MED ORDER — ACETAMINOPHEN 325 MG PO TABS
650.0000 mg | ORAL_TABLET | Freq: Once | ORAL | Status: AC
  Administered 2017-06-26: 650 mg via ORAL
  Filled 2017-06-26: qty 2

## 2017-06-26 NOTE — ED Triage Notes (Signed)
Pt with N/V since last nite. Temp in triage.

## 2017-06-26 NOTE — Discharge Instructions (Signed)
Aloe up with your primary care doctor if any continued problems.  Clear liquids frequently.  Zofran ODT every 8 hours if needed for nausea.  Tylenol if needed for fever.

## 2017-06-26 NOTE — ED Provider Notes (Signed)
[facility] Emergency Department Provider Note  ____________________________________________   First MD Initiated Contact with Patient 06/26/17 1021     (approximate)  I have reviewed the triage vital signs and the nursing notes.   HISTORY  Chief Complaint Nausea and Emesis  HPI Ebony [name] is a 22 y.o. female is here with complaint of nausea and vomiting since last night.  Patient states that she has had fever.  She states she last vomited at 6 AM.  She has not tried to drink anything since that time.  She denies any other symptoms.   Past Medical History:  Diagnosis Date  . Acne   . Eczema, allergic   . History of tonsillectomy     Patient Active Problem List   Diagnosis Date Noted  . Blighted ovum 05/27/2017  . Obesity, pediatric, BMI 95th to 98th percentile for age 27/15/2016  . Intermenstrual bleeding 12/19/2014  . Allergic contact dermatitis 12/17/2014    Past Surgical History:  Procedure Laterality Date  . ADENOIDECTOMY    . DILATION AND EVACUATION N/A 05/29/2017   Procedure: DILATATION AND EVACUATION;  Surgeon: Conard NovakJackson, Stephen D, MD;  Location: [facility] ORS;  Service: Gynecology;  Laterality: N/A;  . TONSILLECTOMY    . WISDOM TOOTH EXTRACTION      Prior to Admission medications   Medication Sig Start Date End Date Taking? Authorizing Provider  ondansetron (ZOFRAN ODT) 4 MG disintegrating tablet Take 1 tablet (4 mg total) by mouth every 8 (eight) hours as needed for nausea or vomiting. 06/26/17   Tommi RumpsSummers, Rhonda L, PA-C    Allergies Patient has no known allergies.  Family History  Problem Relation Age of Onset  . Diabetes Brother     Social History Social History   Tobacco Use  . Smoking status: Former Smoker    Years: 1.00    Types: Cigarettes    Last attempt to quit: 03/28/2017    Years since quitting: 0.2  . Smokeless tobacco: Never Used  . Tobacco comment: 1-2 CIG /WEEK  Substance Use Topics  . Alcohol use: No      Alcohol/week: 0.0 oz  . Drug use: Yes    Frequency: 2.0 times per week    Types: Marijuana    Review of Systems Constitutional: Positive fever/chills Eyes: No visual changes. ENT: No sore throat. Cardiovascular: Denies chest pain. Respiratory: Denies shortness of breath. Gastrointestinal: No abdominal pain.  Positive nausea, positive vomiting.  No diarrhea.   Genitourinary: Negative for dysuria. Musculoskeletal: Negative for back pain. Skin: Negative for rash. Neurological: Negative for headaches, focal weakness or numbness. ____________________________________________   PHYSICAL EXAM:  VITAL SIGNS: ED Triage Vitals [06/26/17 0844]  Enc Vitals Group     BP 107/67     Pulse Rate (!) 120     Resp 18     Temp (!) 100.5 F (38.1 C)     Temp Source Oral     SpO2 98 %     Weight 160 lb (72.6 kg)     Height 5\' 6"  (1.676 m)     Head Circumference      Peak Flow      Pain Score      Pain Loc      Pain Edu?      Excl. in GC?    Constitutional: Alert and oriented. Well appearing and in no acute distress. Eyes: Conjunctivae are normal. PERRL. EOMI. Head: Atraumatic. Nose: No congestion/rhinnorhea. Mouth/Throat: Mucous membranes are moist.  Oropharynx non-erythematous.  Neck: No stridor.   Cardiovascular: Normal rate, regular rhythm. Grossly normal heart sounds.  Good peripheral circulation. Respiratory: Normal respiratory effort.  No retractions. Lungs CTAB. Gastrointestinal: Soft and nontender. No distention.  No CVA tenderness.  Bowel sounds are normal x4 quadrants. Musculoskeletal: Moves upper and lower extremities without any difficulty.  Normal gait was noted. Neurologic:  Normal speech and language. No gross focal neurologic deficits are appreciated. No gait instability. Skin:  Skin is warm, dry and intact. No rash noted. Psychiatric: Mood and affect are normal. Speech and behavior are normal.  ____________________________________________   LABS (all labs  ordered are listed, but only abnormal results are displayed)  Labs Reviewed  INFLUENZA PANEL BY PCR (TYPE A & B)    PROCEDURES  Procedure(s) performed: None  Procedures  Critical Care performed: No  ____________________________________________   INITIAL IMPRESSION / ASSESSMENT AND PLAN / ED COURSE Patient was made aware that flu swab was negative.  There was no vomiting during her ED visit and patient was given a p.o. fluid challenge prior to discharge.  Patient was able to drink fluids without any further nausea or vomiting.  Patient was given a prescription for Zofran if needed for nausea.  She is to follow-up with her PCP if any continued problems.  Patient was improved prior to discharge.  ____________________________________________   FINAL CLINICAL IMPRESSION(S) / ED DIAGNOSES  Final diagnoses:  Viral gastroenteritis     ED Discharge Orders        Ordered    ondansetron (ZOFRAN ODT) 4 MG disintegrating tablet  Every 8 hours PRN     06/26/17 1158       Note:  This document was prepared using Dragon voice recognition software and may include unintentional dictation errors.    Tommi Rumps, PA-C 06/26/17 1208    Jene Every, MD 06/26/17 1335

## 2017-06-26 NOTE — ED Notes (Signed)
See triage note  Presents with body aches and n/v last pm  Developed fever this am

## 2017-06-26 NOTE — ED Notes (Signed)
States she feels better  Able to take PO fluids

## 2017-07-16 ENCOUNTER — Other Ambulatory Visit: Payer: Self-pay

## 2017-07-16 ENCOUNTER — Encounter: Payer: Self-pay | Admitting: Emergency Medicine

## 2017-07-16 ENCOUNTER — Emergency Department
Admission: EM | Admit: 2017-07-16 | Discharge: 2017-07-16 | Disposition: A | Discharge status: Home / Self Care | Payer: Medicaid Other | Attending: Emergency Medicine | Admitting: Emergency Medicine

## 2017-07-16 DIAGNOSIS — Z87891 Personal history of nicotine dependence: Secondary | ICD-10-CM | POA: Insufficient documentation

## 2017-07-16 DIAGNOSIS — Z79899 Other long term (current) drug therapy: Secondary | ICD-10-CM | POA: Diagnosis not present

## 2017-07-16 DIAGNOSIS — J029 Acute pharyngitis, unspecified: Secondary | ICD-10-CM | POA: Diagnosis present

## 2017-07-16 LAB — GROUP A STREP BY PCR: Group A Strep by PCR: NOT DETECTED

## 2017-07-16 MED ORDER — BENZONATATE 100 MG PO CAPS: ORAL_CAPSULE | ORAL | 0 refills | Status: DC

## 2017-07-16 NOTE — Discharge Instructions (Signed)
Your rapid strep test was negative. Take the prescription cough medicine as directed. Consider starting a daily allergy medicine (Allegra, Claritin, Zyrtec). You should follow-up with your provider

## 2017-07-16 NOTE — ED Triage Notes (Signed)
Here for sore throat X 1 week.  No fevers per pt. Unlabored. Handling secretions.  Throat appears WNL.  VSS

## 2017-07-16 NOTE — ED Provider Notes (Signed)
[facility] Emergency Department Provider Note ____________________________________________  Time seen: [id number]  I have reviewed the triage vital signs and the nursing notes.  HISTORY  Chief Complaint  Sore Throat  HPI Ebony [name] is a 22 y.o. female presents herself to the ED for evaluation of one-week complaint of sore throat.  Patient is status post colectomy and adenoid surgery some 10 years prior.  She describes intermittent sore throat pain that was worsened with some solid food ingestion.  She denies any fevers, chills, sweats patient also denies any nausea, vomiting, or otalgia. She does report a mild intermittent, nonproductive cough.  Ports pain at a 3 out of 10 in triage.  Past Medical History:  Diagnosis Date  . Acne   . Eczema, allergic   . History of tonsillectomy     Patient Active Problem List   Diagnosis Date Noted  . Blighted ovum 05/27/2017  . Obesity, pediatric, BMI 95th to 98th percentile for age 79/15/2016  . Intermenstrual bleeding 12/19/2014  . Allergic contact dermatitis 12/17/2014    Past Surgical History:  Procedure Laterality Date  . ADENOIDECTOMY    . DILATION AND EVACUATION N/A 05/29/2017   Procedure: DILATATION AND EVACUATION;  Surgeon: Conard NovakJackson, Stephen D, MD;  Location: [facility] ORS;  Service: Gynecology;  Laterality: N/A;  . TONSILLECTOMY    . WISDOM TOOTH EXTRACTION      Prior to Admission medications   Medication Sig Start Date End Date Taking? Authorizing Provider  benzonatate (TESSALON PERLES) 100 MG capsule Take 1-2 tabs TID prn cough 07/16/17   Fredericka Bottcher, Charlesetta IvoryJenise V Bacon, PA-C  ondansetron (ZOFRAN ODT) 4 MG disintegrating tablet Take 1 tablet (4 mg total) by mouth every 8 (eight) hours as needed for nausea or vomiting. 06/26/17   Tommi RumpsSummers, Rhonda L, PA-C    Allergies Patient has no known allergies.  Family History  Problem Relation Age of Onset  . Diabetes Brother     Social History Social History   Tobacco  Use  . Smoking status: Former Smoker    Years: 1.00    Types: Cigarettes    Last attempt to quit: 03/28/2017    Years since quitting: 0.3  . Smokeless tobacco: Never Used  . Tobacco comment: 1-2 CIG /WEEK  Substance Use Topics  . Alcohol use: No    Alcohol/week: 0.0 oz  . Drug use: Yes    Frequency: 2.0 times per week    Types: Marijuana    Review of Systems  Constitutional: Negative for fever. Eyes: Negative for visual changes. ENT: Positive for sore throat. Cardiovascular: Negative for chest pain. Respiratory: Negative for shortness of breath. Gastrointestinal: Negative for abdominal pain, vomiting and diarrhea. Skin: Negative for rash. Neurological: Negative for headaches, focal weakness or numbness. ____________________________________________  PHYSICAL EXAM:  VITAL SIGNS: ED Triage Vitals  Enc Vitals Group     BP 07/16/17 1823 111/69     Pulse Rate 07/16/17 1823 84     Resp 07/16/17 1823 18     Temp 07/16/17 1823 98.6 F (37 C)     Temp Source 07/16/17 1823 Oral     SpO2 07/16/17 1823 98 %     Weight 07/16/17 1824 155 lb (70.3 kg)     Height 07/16/17 1824 5\' 6"  (1.676 m)     Head Circumference --      Peak Flow --      Pain Score 07/16/17 1825 3     Pain Loc --      Pain  Edu? --      Excl. in GC? --     Constitutional: Alert and oriented. Well appearing and in no distress. Head: Normocephalic and atraumatic. Eyes: Conjunctivae are normal. PERRL. Normal extraocular movements Ears: Canals clear. TMs intact bilaterally. Nose: No congestion/rhinorrhea/epistaxis. Mouth/Throat: Mucous membranes are moist. Uvula is midline. Tonsils are absent. No oropharyngeal erythema is noted.  Neck: Supple. No thyromegaly. Hematological/Lymphatic/Immunological: No cervical lymphadenopathy. Cardiovascular: Normal rate, regular rhythm. Normal distal pulses. Respiratory: Normal respiratory effort. No wheezes/rales/rhonchi. ____________________________________________   LABS  (pertinent positives/negatives)  Labs Reviewed  GROUP A STREP BY PCR  ____________________________________________  INITIAL IMPRESSION / ASSESSMENT AND PLAN / ED COURSE  Patient with ED evaluation of a one-week complaint of intermittent sore throat pain and tightness.  Patient denies any interim fevers.  She is status post tonsil and adenoid surgery.  Her rapid strep test was negative at this presentation.  She is discharged with a prescription for Tessalon Perles to dose as directed.  She is also encouraged to start a daily allergy medicine for symptom relief.  She will follow with primary provider for ongoing worsening symptoms. ____________________________________________  FINAL CLINICAL IMPRESSION(S) / ED DIAGNOSES  Final diagnoses:  Sore throat      Karmen Stabs, Charlesetta Ivory, PA-C 07/16/17 1957    Myrna Blazer, MD 07/16/17 2025

## 2017-08-18 ENCOUNTER — Emergency Department
Admission: EM | Admit: 2017-08-18 | Discharge: 2017-08-18 | Disposition: A | Discharge status: Home / Self Care | Payer: Medicaid Other | Attending: Emergency Medicine | Admitting: Emergency Medicine

## 2017-08-18 ENCOUNTER — Other Ambulatory Visit: Payer: Self-pay

## 2017-08-18 ENCOUNTER — Encounter: Payer: Self-pay | Admitting: *Deleted

## 2017-08-18 DIAGNOSIS — O9989 Other specified diseases and conditions complicating pregnancy, childbirth and the puerperium: Secondary | ICD-10-CM | POA: Diagnosis not present

## 2017-08-18 DIAGNOSIS — Z87891 Personal history of nicotine dependence: Secondary | ICD-10-CM | POA: Diagnosis not present

## 2017-08-18 DIAGNOSIS — R1030 Lower abdominal pain, unspecified: Secondary | ICD-10-CM | POA: Insufficient documentation

## 2017-08-18 DIAGNOSIS — Z79899 Other long term (current) drug therapy: Secondary | ICD-10-CM | POA: Diagnosis not present

## 2017-08-18 DIAGNOSIS — R109 Unspecified abdominal pain: Secondary | ICD-10-CM

## 2017-08-18 DIAGNOSIS — O26891 Other specified pregnancy related conditions, first trimester: Secondary | ICD-10-CM

## 2017-08-18 DIAGNOSIS — Z3A08 8 weeks gestation of pregnancy: Secondary | ICD-10-CM | POA: Diagnosis not present

## 2017-08-18 LAB — COMPREHENSIVE METABOLIC PANEL
ALBUMIN: 4 g/dL (ref 3.5–5.0)
ALT: 10 U/L — ABNORMAL LOW (ref 14–54)
AST: 18 U/L (ref 15–41)
Alkaline Phosphatase: 43 U/L (ref 38–126)
Anion gap: 7 (ref 5–15)
BUN: 17 mg/dL (ref 6–20)
CHLORIDE: 103 mmol/L (ref 101–111)
CO2: 25 mmol/L (ref 22–32)
Calcium: 9.3 mg/dL (ref 8.9–10.3)
Creatinine, Ser: 0.51 mg/dL (ref 0.44–1.00)
GFR calc Af Amer: >60 mL/min (ref >60)
GFR calc non Af Amer: >60 mL/min (ref >60)
GLUCOSE: 99 mg/dL (ref 65–99)
POTASSIUM: 3.3 mmol/L — AB (ref 3.5–5.1)
Sodium: 135 mmol/L (ref 135–145)
Total Bilirubin: 0.5 mg/dL (ref 0.3–1.2)
Total Protein: 7.2 g/dL (ref 6.5–8.1)

## 2017-08-18 LAB — URINALYSIS, COMPLETE (UACMP) WITH MICROSCOPIC
BACTERIA UA: NONE SEEN
Bilirubin Urine: NEGATIVE
Glucose, UA: NEGATIVE mg/dL
Hgb urine dipstick: NEGATIVE
Ketones, ur: 5 mg/dL — AB
Nitrite: NEGATIVE
Protein, ur: NEGATIVE mg/dL
SPECIFIC GRAVITY, URINE: 1.02 (ref 1.005–1.030)
pH: 6 (ref 5.0–8.0)

## 2017-08-18 LAB — CBC
HEMATOCRIT: 36.3 % (ref 35.0–47.0)
Hemoglobin: 12.3 g/dL (ref 12.0–16.0)
MCH: 30.4 pg (ref 26.0–34.0)
MCHC: 33.7 g/dL (ref 32.0–36.0)
MCV: 90.2 fL (ref 80.0–100.0)
Platelets: 163 10*3/uL (ref 150–440)
RBC: 4.03 MIL/uL (ref 3.80–5.20)
RDW: 13.2 % (ref 11.5–14.5)
WBC: 5.2 10*3/uL (ref 3.6–11.0)

## 2017-08-18 LAB — HCG, QUANTITATIVE, PREGNANCY: HCG, BETA CHAIN, QUANT, S: 118482 m[IU]/mL — AB (ref <5)

## 2017-08-18 LAB — POCT PREGNANCY, URINE: PREG TEST UR: POSITIVE — AB

## 2017-08-18 NOTE — ED Triage Notes (Signed)
Pt has abd cramping x 2 days.   Pt reports she is pregnant.  No vag bleeding no urinary sx. Pt has not seen md yet for pregnancy.

## 2017-08-18 NOTE — Discharge Instructions (Addendum)
Please seek medical attention for any high fevers, chest pain, shortness of breath, change in behavior, persistent vomiting, bloody stool or any other new or concerning symptoms.  

## 2017-08-18 NOTE — ED Provider Notes (Signed)
[facility] Emergency Department Provider Note   ____________________________________________   I have reviewed the triage vital signs and the nursing notes.   HISTORY  Chief Complaint Abdominal Pain   History limited by: Not Limited   HPI Ebony [name] is a 22 y.o. female who presents to the emergency department today because of concern for abdominal cramping. Located in the lower abdomen. Has been present for the past two days. It has not been accompanied by any vaginal bleeding. Patient thinks she is about [redacted] weeks pregnant but has not yet had any prenatal care. Has been taking prenatal vitamins. Has history of miscarriage in the past.    Per medical record review patient has a history of dilation and evacuation.  Past Medical History:  Diagnosis Date  . Acne   . Eczema, allergic   . History of tonsillectomy     Patient Active Problem List   Diagnosis Date Noted  . Blighted ovum 05/27/2017  . Obesity, pediatric, BMI 95th to 98th percentile for age 26/15/2016  . Intermenstrual bleeding 12/19/2014  . Allergic contact dermatitis 12/17/2014    Past Surgical History:  Procedure Laterality Date  . ADENOIDECTOMY    . DILATION AND EVACUATION N/A 05/29/2017   Procedure: DILATATION AND EVACUATION;  Surgeon: Conard Novak, MD;  Location: [facility] ORS;  Service: Gynecology;  Laterality: N/A;  . TONSILLECTOMY    . WISDOM TOOTH EXTRACTION      Prior to Admission medications   Medication Sig Start Date End Date Taking? Authorizing Provider  benzonatate (TESSALON PERLES) 100 MG capsule Take 1-2 tabs TID prn cough 07/16/17   Menshew, Charlesetta Ivory, PA-C  ondansetron (ZOFRAN ODT) 4 MG disintegrating tablet Take 1 tablet (4 mg total) by mouth every 8 (eight) hours as needed for nausea or vomiting. 06/26/17   Tommi Rumps, PA-C    Allergies Patient has no known allergies.  Family History  Problem Relation Age of Onset  . Diabetes Brother      Social History Social History   Tobacco Use  . Smoking status: Former Smoker    Years: 1.00    Types: Cigarettes    Last attempt to quit: 03/28/2017    Years since quitting: 0.3  . Smokeless tobacco: Never Used  . Tobacco comment: 1-2 CIG /WEEK  Substance Use Topics  . Alcohol use: No    Alcohol/week: 0.0 oz  . Drug use: Yes    Frequency: 2.0 times per week    Types: Marijuana    Review of Systems Constitutional: No fever/chills Eyes: No visual changes. ENT: No sore throat. Cardiovascular: Denies chest pain. Respiratory: Denies shortness of breath. Gastrointestinal: Positive for lower abdominal cramping. Genitourinary: Negative for dysuria. Musculoskeletal: Negative for back pain. Skin: Negative for rash. Neurological: Negative for headaches, focal weakness or numbness.  ____________________________________________   PHYSICAL EXAM:  VITAL SIGNS: ED Triage Vitals  Enc Vitals Group     BP 08/18/17 1801 109/66     Pulse Rate 08/18/17 1801 98     Resp 08/18/17 1801 18     Temp 08/18/17 1801 98.9 F (37.2 C)     Temp Source 08/18/17 1801 Oral     SpO2 08/18/17 1801 99 %     Weight 08/18/17 1759 160 lb (72.6 kg)     Height 08/18/17 1759 5\' 6"  (1.676 m)     Head Circumference --      Peak Flow --      Pain Score 08/18/17 1759 4  Constitutional: Alert and oriented. Well appearing and in no distress. Eyes: Conjunctivae are normal.  ENT   Head: Normocephalic and atraumatic.   Nose: No congestion/rhinnorhea.   Mouth/Throat: Mucous membranes are moist.   Neck: No stridor. Hematological/Lymphatic/Immunilogical: No cervical lymphadenopathy. Cardiovascular: Normal rate, regular rhythm.  No murmurs, rubs, or gallops.  Respiratory: Normal respiratory effort without tachypnea nor retractions. Breath sounds are clear and equal bilaterally. No wheezes/rales/rhonchi. Gastrointestinal: Soft and non tender. No rebound. No guarding.  Genitourinary:  Deferred Musculoskeletal: Normal range of motion in all extremities. No lower extremity edema. Neurologic:  Normal speech and language. No gross focal neurologic deficits are appreciated.  Skin:  Skin is warm, dry and intact. No rash noted. Psychiatric: Mood and affect are normal. Speech and behavior are normal. Patient exhibits appropriate insight and judgment.  ____________________________________________    LABS (pertinent positives/negatives)  Upreg positive CMP k 3.3, alt 10 otherwise wnl CBC wbc 5.2, hgb 12.3, plt 163 UA 0-5 RBC, WBC, trace leukocytes HCG [id number] ____________________________________________   EKG  None  ____________________________________________    RADIOLOGY  None  ____________________________________________   PROCEDURES  Procedures  ____________________________________________   INITIAL IMPRESSION / ASSESSMENT AND PLAN / ED COURSE  Pertinent labs & imaging results that were available during my care of the patient were reviewed by me and considered in my medical decision making (see chart for details).  She presented to the emergency department today because of concerns for abdominal cramping.  Patient does think she is roughly [redacted] weeks pregnant.  Blood work without any concerning findings.  Beta hCG over 100,000.  Bedside ultrasound does show a single intrauterine pregnancy with heartbeat present.  Discussed this with the patient.  Discussed importance of establishing care with OB/GYN doctor.   ____________________________________________   FINAL CLINICAL IMPRESSION(S) / ED DIAGNOSES  Final diagnoses:  Abdominal pain during pregnancy in first trimester     Note: This dictation was prepared with Dragon dictation. Any transcriptional errors that result from this process are unintentional     Phineas SemenGoodman, Corry Ihnen, MD 08/18/17 2220

## 2017-08-18 NOTE — ED Notes (Signed)
poct pregnancy Positive 

## 2017-08-25 ENCOUNTER — Telehealth: Payer: Self-pay

## 2017-08-25 NOTE — Telephone Encounter (Signed)
Pt calling to see if she could take mucinex [redacted] weeks pregnant. I left message to return call, regular mucinex is fine .

## 2017-08-28 ENCOUNTER — Encounter: Payer: Self-pay | Admitting: Emergency Medicine

## 2017-08-28 ENCOUNTER — Emergency Department: Payer: Medicaid Other

## 2017-08-28 ENCOUNTER — Emergency Department
Admission: EM | Admit: 2017-08-28 | Discharge: 2017-08-28 | Disposition: A | Discharge status: Home / Self Care | Payer: Medicaid Other | Attending: Emergency Medicine | Admitting: Emergency Medicine

## 2017-08-28 DIAGNOSIS — O09291 Supervision of pregnancy with other poor reproductive or obstetric history, first trimester: Secondary | ICD-10-CM

## 2017-08-28 DIAGNOSIS — Z87891 Personal history of nicotine dependence: Secondary | ICD-10-CM | POA: Insufficient documentation

## 2017-08-28 DIAGNOSIS — R1084 Generalized abdominal pain: Secondary | ICD-10-CM | POA: Insufficient documentation

## 2017-08-28 DIAGNOSIS — N939 Abnormal uterine and vaginal bleeding, unspecified: Secondary | ICD-10-CM

## 2017-08-28 DIAGNOSIS — O26899 Other specified pregnancy related conditions, unspecified trimester: Secondary | ICD-10-CM

## 2017-08-28 DIAGNOSIS — Z3A09 9 weeks gestation of pregnancy: Secondary | ICD-10-CM | POA: Diagnosis not present

## 2017-08-28 DIAGNOSIS — O9989 Other specified diseases and conditions complicating pregnancy, childbirth and the puerperium: Secondary | ICD-10-CM | POA: Insufficient documentation

## 2017-08-28 DIAGNOSIS — O200 Threatened abortion: Secondary | ICD-10-CM

## 2017-08-28 DIAGNOSIS — O209 Hemorrhage in early pregnancy, unspecified: Secondary | ICD-10-CM

## 2017-08-28 DIAGNOSIS — R109 Unspecified abdominal pain: Secondary | ICD-10-CM

## 2017-08-28 LAB — HCG, QUANTITATIVE, PREGNANCY: hCG, Beta Chain, Quant, S: 158386 m[IU]/mL — ABNORMAL HIGH (ref <5)

## 2017-08-28 LAB — POC URINE PREG, ED: Preg Test, Ur: POSITIVE — AB

## 2017-08-28 NOTE — ED Provider Notes (Signed)
[facility] Emergency Department Provider Note       Time seen: ----------------------------------------- 12:45 PM on 08/28/2017 -----------------------------------------   I have reviewed the triage vital signs and the nursing notes.  HISTORY   Chief Complaint Abdominal Cramping    HPI Ebony [name] is a 22 y.o. female with no significant past medical history who presents to the ED for abdominal cramping.  Patient states she is around [redacted] weeks pregnant, she is G3, P1 Ab0.  She did have some spotting yesterday but has not had any vaginal bleeding today.  She denies fevers, chills or other complaints.  She also denies vomiting or diarrhea.  Past Medical History:  Diagnosis Date  . Acne   . Eczema, allergic   . History of tonsillectomy     Patient Active Problem List   Diagnosis Date Noted  . Blighted ovum 05/27/2017  . Obesity, pediatric, BMI 95th to 98th percentile for age 50/15/2016  . Intermenstrual bleeding 12/19/2014  . Allergic contact dermatitis 12/17/2014    Past Surgical History:  Procedure Laterality Date  . ADENOIDECTOMY    . DILATION AND EVACUATION N/A 05/29/2017   Procedure: DILATATION AND EVACUATION;  Surgeon: Conard NovakJackson, Stephen D, MD;  Location: [facility] ORS;  Service: Gynecology;  Laterality: N/A;  . TONSILLECTOMY    . WISDOM TOOTH EXTRACTION      Allergies Patient has no known allergies.  Social History Social History   Tobacco Use  . Smoking status: Former Smoker    Years: 1.00    Types: Cigarettes    Last attempt to quit: 03/28/2017    Years since quitting: 0.4  . Smokeless tobacco: Never Used  . Tobacco comment: 1-2 CIG /WEEK  Substance Use Topics  . Alcohol use: No    Alcohol/week: 0.0 oz  . Drug use: Not Currently    Frequency: 2.0 times per week    Types: Marijuana   Review of Systems Constitutional: Negative for fever. Cardiovascular: Negative for chest pain. Respiratory: Negative for shortness of  breath. Gastrointestinal: Positive for abdominal cramping Genitourinary: Negative for dysuria. positive for occasional vaginal spotting Musculoskeletal: Negative for back pain. Skin: Negative for rash. Neurological: Negative for headaches, focal weakness or numbness.  All systems negative/normal/unremarkable except as stated in the HPI  ____________________________________________   PHYSICAL EXAM:  VITAL SIGNS: ED Triage Vitals  Enc Vitals Group     BP 08/28/17 1153 119/75     Pulse Rate 08/28/17 1153 73     Resp 08/28/17 1153 17     Temp 08/28/17 1153 97.8 F (36.6 C)     Temp Source 08/28/17 1153 Oral     SpO2 08/28/17 1153 100 %     Weight 08/28/17 1154 160 lb (72.6 kg)     Height 08/28/17 1154 5\' 6"  (1.676 m)     Head Circumference --      Peak Flow --      Pain Score 08/28/17 1154 7     Pain Loc --      Pain Edu? --      Excl. in GC? --    Constitutional: Alert and oriented. Well appearing and in no distress. Eyes: Conjunctivae are normal. Normal extraocular movements. Cardiovascular: Normal rate, regular rhythm. No murmurs, rubs, or gallops. Respiratory: Normal respiratory effort without tachypnea nor retractions. Breath sounds are clear and equal bilaterally. No wheezes/rales/rhonchi. Gastrointestinal: Soft and nontender. Normal bowel sounds Musculoskeletal: Nontender with normal range of motion in extremities. No lower extremity tenderness nor edema. Neurologic:  Normal speech and language. No gross focal neurologic deficits are appreciated.  Skin:  Skin is warm, dry and intact. No rash noted. Psychiatric: Mood and affect are normal. Speech and behavior are normal.  ____________________________________________  ED COURSE:  As part of my medical decision making, I reviewed the following data within the electronic MEDICAL RECORD NUMBER History obtained from family if available, nursing notes, old chart and ekg, as well as notes from prior ED visits. Patient presented  for abdominal cramping and vaginal bleeding, we will assess with labs and imaging as indicated at this time. Clinical Course as of Aug 29 1406  Thu Aug 28, 2017  1400 Preg Test, Ur(!): Positive [JW]  1400 HCG, Newman Nickels(!): 161,096 [JW]    Clinical Course User Index [JW] Emily Filbert, MD   Procedures ____________________________________________   LABS (pertinent positives/negatives)  Labs Reviewed  HCG, QUANTITATIVE, PREGNANCY - Abnormal; Notable for the following components:      Result Value   hCG, Beta Chain, Quant, [place] 045,409 (*)    All other components within normal limits  POC URINE PREG, ED - Abnormal; Notable for the following components:   Preg Test, Ur Positive (*)    All other components within normal limits    RADIOLOGY Images were viewed by me  Pregnancy ultrasound IMPRESSION: Single live intrauterine gestation of 9 weeks 1 day. Small subchronic hemorrhage is noted.  ____________________________________________  DIFFERENTIAL DIAGNOSIS   Normal pregnancy, miscarriage, ectopic pregnancy  FINAL ASSESSMENT AND PLAN  First trimester pregnancy   Plan: The patient had presented for cramping and recent vaginal spotting. Patient's labs were unremarkable, hCG level is rising. Patient's imaging did reveal a 9-week and 1 day intrauterine pregnancy.  She is cleared for outpatient follow-up.   Ulice Dash, MD   Note: This note was generated in part or whole with voice recognition software. Voice recognition is usually quite accurate but there are transcription errors that can and very often do occur. I apologize for any typographical errors that were not detected and corrected.     Emily Filbert, MD 08/28/17 1409

## 2017-08-28 NOTE — ED Triage Notes (Signed)
Pt comes into the ED via POV c/o abdominal cramping.  Patient is currently [redacted] weeks pregnant with h/o miscarriage in the past.  Patient states she had minimal spotting yesterday but has had none today.  Patient in NAD at this time with even and unlabored respirations.

## 2017-08-28 NOTE — ED Notes (Signed)
Informed RN that patient has been roomed and is ready for evaluation.  Patient in NAD at this time and call bell placed within reach.   

## 2017-09-10 ENCOUNTER — Encounter: Payer: Self-pay | Admitting: Obstetrics and Gynecology

## 2017-10-07 ENCOUNTER — Other Ambulatory Visit: Payer: Self-pay

## 2017-10-07 ENCOUNTER — Encounter: Payer: Self-pay | Admitting: Emergency Medicine

## 2017-10-07 ENCOUNTER — Emergency Department
Admission: EM | Admit: 2017-10-07 | Discharge: 2017-10-07 | Disposition: A | Discharge status: Home / Self Care | Payer: Medicaid Other | Attending: Emergency Medicine | Admitting: Emergency Medicine

## 2017-10-07 DIAGNOSIS — Y9389 Activity, other specified: Secondary | ICD-10-CM | POA: Diagnosis not present

## 2017-10-07 DIAGNOSIS — Z87891 Personal history of nicotine dependence: Secondary | ICD-10-CM | POA: Diagnosis not present

## 2017-10-07 DIAGNOSIS — O9A211 Injury, poisoning and certain other consequences of external causes complicating pregnancy, first trimester: Secondary | ICD-10-CM | POA: Diagnosis present

## 2017-10-07 DIAGNOSIS — X58XXXA Exposure to other specified factors, initial encounter: Secondary | ICD-10-CM | POA: Insufficient documentation

## 2017-10-07 DIAGNOSIS — Y998 Other external cause status: Secondary | ICD-10-CM | POA: Insufficient documentation

## 2017-10-07 DIAGNOSIS — K0889 Other specified disorders of teeth and supporting structures: Secondary | ICD-10-CM

## 2017-10-07 DIAGNOSIS — Y929 Unspecified place or not applicable: Secondary | ICD-10-CM | POA: Insufficient documentation

## 2017-10-07 DIAGNOSIS — S025XXB Fracture of tooth (traumatic), initial encounter for open fracture: Secondary | ICD-10-CM

## 2017-10-07 MED ORDER — AMOXICILLIN 500 MG PO CAPS: 500.0000 mg | ORAL_CAPSULE | Freq: Three times a day (TID) | ORAL | 0 refills | Status: DC

## 2017-10-07 MED ORDER — LIDOCAINE-EPINEPHRINE 2 %-1:100000 IJ SOLN
1.7000 mL | Freq: Once | INTRAMUSCULAR | Status: AC
  Administered 2017-10-07: 1.7 mL
  Filled 2017-10-07: qty 1.7

## 2017-10-07 NOTE — ED Provider Notes (Signed)
[facility] Emergency Department Provider Note ____________________________________________  Time seen: 1007  I have reviewed the triage vital signs and the nursing notes.  HISTORY  Chief Complaint  Dental Pain  HPI Ebony [name] is a 22 y.o. female presents to the ED for evaluation of dental pain.  She reports a broken tooth last week and notes pain related to that nontraumatic injury.  The patient is 6712 weeks gestational age. She notes cold and heat sensitivity to the 2nd molar on the left lower jaw.  She denies any abdominal pain, vaginal bleeding, or fevers.   Past Medical History:  Diagnosis Date  . Acne   . Eczema, allergic   . History of tonsillectomy     Patient Active Problem List   Diagnosis Date Noted  . Blighted ovum 05/27/2017  . Obesity, pediatric, BMI 95th to 98th percentile for age 62/15/2016  . Intermenstrual bleeding 12/19/2014  . Allergic contact dermatitis 12/17/2014    Past Surgical History:  Procedure Laterality Date  . ADENOIDECTOMY    . DILATION AND EVACUATION N/A 05/29/2017   Procedure: DILATATION AND EVACUATION;  Surgeon: Conard NovakJackson, Stephen D, MD;  Location: [facility] ORS;  Service: Gynecology;  Laterality: N/A;  . TONSILLECTOMY    . WISDOM TOOTH EXTRACTION      Prior to Admission medications   Medication Sig Start Date End Date Taking? Authorizing Provider  amoxicillin (AMOXIL) 500 MG capsule Take 1 capsule (500 mg total) by mouth 3 (three) times daily. 10/07/17   Yzabella Crunk, Charlesetta IvoryJenise V Bacon, PA-C  benzonatate (TESSALON PERLES) 100 MG capsule Take 1-2 tabs TID prn cough 07/16/17   Farhana Fellows, Charlesetta IvoryJenise V Bacon, PA-C  ondansetron (ZOFRAN ODT) 4 MG disintegrating tablet Take 1 tablet (4 mg total) by mouth every 8 (eight) hours as needed for nausea or vomiting. 06/26/17   Tommi RumpsSummers, Rhonda L, PA-C    Allergies Patient has no known allergies.  Family History  Problem Relation Age of Onset  . Diabetes Brother     Social History Social  History   Tobacco Use  . Smoking status: Former Smoker    Years: 1.00    Types: Cigarettes    Last attempt to quit: 03/28/2017    Years since quitting: 0.5  . Smokeless tobacco: Never Used  . Tobacco comment: 1-2 CIG /WEEK  Substance Use Topics  . Alcohol use: No    Alcohol/week: 0.0 oz  . Drug use: Not Currently    Frequency: 2.0 times per week    Types: Marijuana    Review of Systems  Constitutional: Negative for fever. Eyes: Negative for visual changes. ENT: Negative for sore throat. Dental pain as above Cardiovascular: Negative for chest pain. Respiratory: Negative for shortness of breath. Musculoskeletal: Negative for back pain. Skin: Negative for rash. Neurological: Negative for headaches, focal weakness or numbness. ____________________________________________  PHYSICAL EXAM:  VITAL SIGNS: ED Triage Vitals  Enc Vitals Group     BP 10/07/17 0856 117/71     Pulse Rate 10/07/17 0856 80     Resp 10/07/17 0856 18     Temp 10/07/17 0856 98.2 F (36.8 C)     Temp Source 10/07/17 0856 Oral     SpO2 10/07/17 0856 100 %     Weight 10/07/17 0856 154 lb (69.9 kg)     Height 10/07/17 0856 5\' 6"  (1.676 m)     Head Circumference --      Peak Flow --      Pain Score 10/07/17 0907 6  Pain Loc --      Pain Edu? --      Excl. in GC? --     Constitutional: Alert and oriented. Well appearing and in no distress. Head: Normocephalic and atraumatic. Eyes: Conjunctivae are normal. PERRL. Normal extraocular movements Ears: Canals clear. TMs intact bilaterally. Nose: No congestion/rhinorrhea/epistaxis. Mouth/Throat: Mucous membranes are moist.  He was midline and tonsils are flat.  No oropharyngeal lesions appreciated.  No focal gum swelling is noted.  Patient has a broken second molar on the lower left jaw. Neck: Supple. No thyromegaly. Cardiovascular: Normal rate, regular rhythm. Normal distal pulses. Respiratory: Normal respiratory effort. No  wheezes/rales/rhonchi. Neurologic:  Normal gait without ataxia. Normal speech and language. No gross focal neurologic deficits are appreciated. Skin:  Skin is warm, dry and intact. No rash noted. ____________________________________________  PROCEDURES  Procedures DENTAL BLOCK  Performed by: Lissa Hoard Consent: Verbal consent obtained. Required items: devices and special equipment available Time out: Immediately prior to procedure a "time out" was called to verify the correct patient, procedure, equipment, support staff and site/side marked as required.  Indication: pain Nerve block body site: inferior alveolar nerve   Preparation: Patient was prepped and draped in the usual sterile fashion. Needle gauge: 27 G Location technique: anatomical landmarks  Local anesthetic: lido-epi 2%-1:100000  Anesthetic total: 1.5 ml  Outcome: anesthesia achieved Tooth defect packed with Cemex packing Patient tolerance: Patient tolerated the procedure well with no immediate complications. ____________________________________________  INITIAL IMPRESSION / ASSESSMENT AND PLAN / ED COURSE  Patient with ED evaluation of acute dental pain secondary to a nontraumatic molar fracture.  Patient with exposed nerve root to the left lower second molar.  Patient tolerates local anesthesia well and the molar defect successfully packed with Cemex temporary cement.  She is given a list of dental providers for definitive management of her symptoms.  Return precautions have been reviewed patient is also given an empiric prescription for amoxicillin, to dose as discussed. ____________________________________________  FINAL CLINICAL IMPRESSION(S) / ED DIAGNOSES  Final diagnoses:  Open fracture of tooth, initial encounter  Pain, dental      Lissa Hoard, PA-C 10/07/17 1932    Minna Antis, MD 10/08/17 2101

## 2017-10-07 NOTE — Discharge Instructions (Addendum)
Keep the tooth packing in place. Replace with the OTC version if necessary. Follow-up with your OB provider for clearance for a dentist to perform the cavity repair. Take the antibiotic is needed.   OPTIONS FOR DENTAL FOLLOW UP CARE  [place] Department of Health and Human Services - Local Safety Net Dental Clinics TripDoors.comhttp://www.ncdhhs.gov/dph/oralhealth/services/safetynetclinics.htm   [facility] [phone])  [facility] [phone])  [facility] [place] [phone]  [facility] [phone])  [facility] [phone]) This clinic caters to the indigent population and is on a lottery system. Location: [employer], [address] [facility], [address], [place] [facility] Hours: Wednesdays from 6pm - 9pm, patients seen by a lottery system. For dates, call or go to [email or web address] Services: Cleanings, fillings and simple extractions. Payment Options: DENTAL WORK IS FREE OF CHARGE. Bring proof of income or support. Best way to get seen: Arrive at 5:15 pm - this is a lottery, NOT first come/first serve, so arriving earlier will not increase your chances of being seen.     [facility] [phone] Select option 1 for emergencies   Location: [facility], [facility], [address], [place] [facility] Hours: No walk-ins accepted - call the day before to schedule an appointment. Check in times are 9:30 am and 1:30 pm. Services: Simple extractions, temporary fillings, pulpectomy/pulp debridement, uncomplicated abscess drainage. Payment Options: PAYMENT IS DUE AT THE TIME OF SERVICE.  Fee is usually $100-200, additional surgical procedures (e.g. abscess drainage) may be extra. Cash, checks, Visa/MasterCard accepted.  Can file Medicaid if patient is covered for dental - patient should call case worker to check. No discount for [facility]  patients. Best way to get seen: MUST call the day before and get onto the schedule. Can usually be seen the next 1-2 days. No walk-ins accepted.     [facility] [phone]   Location: [facility], [address], [place] [facility] Hours: M, W, Th, F 8am or 1:30pm, Tues 9a or 1:30 - first come/first served. Services: Simple extractions, temporary fillings, uncomplicated abscess drainage.  You do not need to be [place] [place] [facility] [place] resident. Payment Options: PAYMENT IS DUE AT THE TIME OF SERVICE. Dental insurance, otherwise sliding scale - bring proof of income or support. Depending on income and treatment needed, cost is usually $50-200. Best way to get seen: Arrive early as it is first come/first served.     [facility] [phone]   Location: [address] Clinic Hours: Mon-Thu 8a-5p Services: Most basic dental services including extractions and fillings. Payment Options: PAYMENT IS DUE AT THE TIME OF SERVICE. Sliding scale, up to 50% off - bring proof if income or support. Medicaid with dental option accepted. Best way to get seen: Call to schedule an appointment, can usually be seen within 2 weeks OR they will try to see walk-ins - show up at 8a or 2p (you may have to wait).     [facility] [phone] [place] RESIDENTS ONLY   Location: [facility], [address], [place], [place] [address] Clinic Hours: By appointment only. Monday - Thursday 8am-5pm, Friday 8am-12pm Services: Cleanings, fillings, extractions. Payment Options: PAYMENT IS DUE AT THE TIME OF SERVICE. Cash, Visa or MasterCard. Sliding scale - $30 minimum per service. Best way to get seen: Come in to office, complete packet and make an appointment - need proof of income or support  monies for each household member and proof of [facility] residence. Usually takes about a month to  get in.     [facility] [phone]   Location: [address] [place] Clinic Hours: Walk-in Urgent Care Dental Services are offered Monday-Friday mornings only. The numbers of emergencies accepted daily is limited to the number of providers available. Maximum 15 - Mondays, Wednesdays & Thursdays Maximum 10 - Tuesdays & Fridays Services: You do not need to be a [facility] resident to be seen for a dental emergency. Emergencies are defined as pain, swelling, abnormal bleeding, or dental trauma. Walkins will receive x-rays if needed. NOTE: Dental cleaning is not an emergency. Payment Options: PAYMENT IS DUE AT THE TIME OF SERVICE. Minimum co-pay is $40.00 for uninsured patients. Minimum co-pay is $3.00 for Medicaid with dental coverage. Dental Insurance is accepted and must be presented at time of visit. Medicare does not cover dental. Forms of payment: Cash, credit card, checks. Best way to get seen: If not previously registered with the clinic, walk-in dental registration begins at 7:15 am and is on a first come/first serve basis. If previously registered with the clinic, call to make an appointment.     [facility] [place] ONLY   Location: [address] [place], [place] Clinic Hours: Mon-Thu 10a-2p Services: Extractions only! Payment Options: FREE (donations accepted) - bring proof of income or support Best way to get seen: Call and schedule an appointment OR come at 8am on the 1st Monday of every month (except for holidays) when it is first come/first served.     [facility] [phone]   Location: [place], [place] Clinic Hours: Friday mornings Services, Payment Options, Best way to get seen: Call for info

## 2017-10-07 NOTE — ED Triage Notes (Addendum)
States she broke a tooth last week  conts to have pain with min swelling to left pt is preg  [facility] 04[facility]04[facility]

## 2017-10-20 ENCOUNTER — Emergency Department
Admission: EM | Admit: 2017-10-20 | Discharge: 2017-10-20 | Disposition: A | Discharge status: Home / Self Care | Payer: Medicaid Other | Attending: Emergency Medicine | Admitting: Emergency Medicine

## 2017-10-20 ENCOUNTER — Encounter: Payer: Self-pay | Admitting: Emergency Medicine

## 2017-10-20 DIAGNOSIS — O23592 Infection of other part of genital tract in pregnancy, second trimester: Secondary | ICD-10-CM | POA: Diagnosis not present

## 2017-10-20 DIAGNOSIS — Z87891 Personal history of nicotine dependence: Secondary | ICD-10-CM | POA: Diagnosis not present

## 2017-10-20 DIAGNOSIS — Z79899 Other long term (current) drug therapy: Secondary | ICD-10-CM | POA: Diagnosis not present

## 2017-10-20 DIAGNOSIS — N898 Other specified noninflammatory disorders of vagina: Secondary | ICD-10-CM | POA: Diagnosis present

## 2017-10-20 DIAGNOSIS — O2342 Unspecified infection of urinary tract in pregnancy, second trimester: Secondary | ICD-10-CM | POA: Insufficient documentation

## 2017-10-20 DIAGNOSIS — B373 Candidiasis of vulva and vagina: Secondary | ICD-10-CM | POA: Insufficient documentation

## 2017-10-20 DIAGNOSIS — Z3A15 15 weeks gestation of pregnancy: Secondary | ICD-10-CM | POA: Insufficient documentation

## 2017-10-20 DIAGNOSIS — B3731 Acute candidiasis of vulva and vagina: Secondary | ICD-10-CM

## 2017-10-20 DIAGNOSIS — N390 Urinary tract infection, site not specified: Secondary | ICD-10-CM

## 2017-10-20 LAB — WET PREP, GENITAL
CLUE CELLS WET PREP: NONE SEEN
Sperm: NONE SEEN
TRICH WET PREP: NONE SEEN

## 2017-10-20 LAB — URINALYSIS, COMPLETE (UACMP) WITH MICROSCOPIC
Bilirubin Urine: NEGATIVE
Glucose, UA: NEGATIVE mg/dL
KETONES UR: NEGATIVE mg/dL
Nitrite: NEGATIVE
PH: 6.5 (ref 5.0–8.0)
Specific Gravity, Urine: 1.02 (ref 1.005–1.030)
WBC, UA: >50 WBC/hpf (ref 0–5)

## 2017-10-20 LAB — CHLAMYDIA/NGC RT PCR (ARMC ONLY)
Chlamydia Tr: NOT DETECTED
N gonorrhoeae: NOT DETECTED

## 2017-10-20 MED ORDER — CLOTRIMAZOLE 1 % VA CREA: 1.0000 | TOPICAL_CREAM | Freq: Every day | VAGINAL | 0 refills | Status: AC

## 2017-10-20 MED ORDER — NITROFURANTOIN MONOHYD MACRO 100 MG PO CAPS: 100.0000 mg | ORAL_CAPSULE | Freq: Two times a day (BID) | ORAL | 0 refills | Status: AC

## 2017-10-20 NOTE — Discharge Instructions (Signed)
Please follow-up with your doctor for recheck/reevaluation. 

## 2017-10-20 NOTE — ED Triage Notes (Signed)
Patient is approx. [redacted] weeks pregnant.  She was recently treated for bacterial vaginosis.  She is now having white discharge with itching and swelling.  Patient states, "It's really uncomfortable and itchy."  [facility].

## 2017-10-20 NOTE — ED Provider Notes (Addendum)
[facility] Emergency Department Provider Note  Time seen: 4:52 PM  I have reviewed the triage vital signs and the nursing notes.   HISTORY  Chief Complaint Vaginal Discharge    HPI Ebony [name] is a 22 y.o. female with no significant past medical history G3, P1 A1 approximately [redacted] weeks pregnant who presents to the emergency department with vaginal discharge.  According to the patient she had been treated proximal he 1 week ago for vaginal infection she thinks bacterial vaginitis.  States she finished her antibiotics but is now having a white discharge with a lot of itching.  Denies significant dysuria.  Denies any abdominal pain, vaginal bleeding.  Largely negative review of systems otherwise.   Past Medical History:  Diagnosis Date  . Acne   . Eczema, allergic   . History of tonsillectomy     Patient Active Problem List   Diagnosis Date Noted  . Blighted ovum 05/27/2017  . Obesity, pediatric, BMI 95th to 98th percentile for age 55/15/2016  . Intermenstrual bleeding 12/19/2014  . Allergic contact dermatitis 12/17/2014    Past Surgical History:  Procedure Laterality Date  . ADENOIDECTOMY    . DILATION AND EVACUATION N/A 05/29/2017   Procedure: DILATATION AND EVACUATION;  Surgeon: Conard Novak, MD;  Location: [facility] ORS;  Service: Gynecology;  Laterality: N/A;  . TONSILLECTOMY    . WISDOM TOOTH EXTRACTION      Prior to Admission medications   Medication Sig Start Date End Date Taking? Authorizing Provider  amoxicillin (AMOXIL) 500 MG capsule Take 1 capsule (500 mg total) by mouth 3 (three) times daily. 10/07/17   Menshew, Charlesetta Ivory, PA-C  benzonatate (TESSALON PERLES) 100 MG capsule Take 1-2 tabs TID prn cough 07/16/17   Menshew, Charlesetta Ivory, PA-C  ondansetron (ZOFRAN ODT) 4 MG disintegrating tablet Take 1 tablet (4 mg total) by mouth every 8 (eight) hours as needed for nausea or vomiting. 06/26/17   Tommi Rumps, PA-C    No  Known Allergies  Family History  Problem Relation Age of Onset  . Diabetes Brother     Social History Social History   Tobacco Use  . Smoking status: Former Smoker    Years: 1.00    Types: Cigarettes    Last attempt to quit: 03/28/2017    Years since quitting: 0.5  . Smokeless tobacco: Never Used  . Tobacco comment: 1-2 CIG /WEEK  Substance Use Topics  . Alcohol use: No    Alcohol/week: 0.0 oz  . Drug use: Not Currently    Frequency: 2.0 times per week    Types: Marijuana    Review of Systems Constitutional: Negative for fever. Eyes: Negative for visual complaints ENT: Negative for recent illness/congestion Cardiovascular: Negative for chest pain. Respiratory: Negative for shortness of breath. Gastrointestinal: Negative for abdominal pain, vomiting and diarrhea. Genitourinary: Positive for vaginal discharge, weight, vaginal itching. Musculoskeletal: Negative for musculoskeletal complaints Skin: Negative for skin complaints  Neurological: Negative for headache All other ROS negative  ____________________________________________   PHYSICAL EXAM:  VITAL SIGNS: ED Triage Vitals [10/20/17 1456]  Enc Vitals Group     BP 115/73     Pulse Rate 96     Resp 16     Temp 98.7 F (37.1 C)     Temp Source Oral     SpO2 99 %     Weight 156 lb (70.8 kg)     Height 5\' 6"  (1.676 m)     Head  Circumference      Peak Flow      Pain Score 4     Pain Loc      Pain Edu?      Excl. in GC?    Constitutional: Alert and oriented. Well appearing and in no distress. Eyes: Normal exam ENT   Head: Normocephalic and atraumatic.   Mouth/Throat: Mucous membranes are moist. Cardiovascular: Normal rate, regular rhythm. No murmur Respiratory: Normal respiratory effort without tachypnea nor retractions. Breath sounds are clear Gastrointestinal: Soft and nontender. No distention.   Musculoskeletal: Nontender with normal range of motion in all extremities.  Neurologic:  Normal  speech and language. No gross focal neurologic deficits Skin:  Skin is warm, dry and intact.  Psychiatric: Mood and affect are normal.   ____________________________________________   INITIAL IMPRESSION / ASSESSMENT AND PLAN / ED COURSE  Pertinent labs & imaging results that were available during my care of the patient were reviewed by me and considered in my medical decision making (see chart for details).  Patient presents to the emergency department for vaginal discharge and itching.  States she was treated 1 week ago for likely bacterial vaginitis, nausea experiencing white vaginal discharge with itching.  Denies any abdominal pain or fever.  Largely negative review of systems.  We will perform a bedside ultrasound just to ensure health of the fetus.  I will perform a pelvic examination to check for swabs.  Patient's urinalysis is consistent with a urinary tract infection however there is lots of white blood cells many bacteria.  We will cover with Macrobid.  Patient's wet prep is positive for yeast infection.  Given the patient's urinalysis appears to show many bacteria we will cover with Macrobid and a prolonged topical coverage for Candida vaginitis.  Patient agreeable to plan of care.  Patient will follow up with OB.  Bedside ultrasound performed by myself shows good fetal movement with normal-appearing heart rate, normal amount of amniotic fluid.  Overall reassuring bedside ultrasound.  ____________________________________________   FINAL CLINICAL IMPRESSION(S) / ED DIAGNOSES  Urinary tract infection Vaginal discharge    Minna AntisPaduchowski, Gisell Buehrle, MD 10/20/17 1744    Minna AntisPaduchowski, Ronan Dion, MD 10/20/17 [phone]

## 2017-10-22 LAB — URINE CULTURE

## 2017-12-20 ENCOUNTER — Observation Stay
Admission: EM | Admit: 2017-12-20 | Discharge: 2017-12-20 | Disposition: A | Discharge status: Home / Self Care | Payer: Medicaid Other | Attending: Obstetrics and Gynecology | Admitting: Obstetrics and Gynecology

## 2017-12-20 DIAGNOSIS — Z79899 Other long term (current) drug therapy: Secondary | ICD-10-CM | POA: Insufficient documentation

## 2017-12-20 DIAGNOSIS — Z87891 Personal history of nicotine dependence: Secondary | ICD-10-CM | POA: Diagnosis not present

## 2017-12-20 DIAGNOSIS — Z3A25 25 weeks gestation of pregnancy: Secondary | ICD-10-CM | POA: Diagnosis not present

## 2017-12-20 DIAGNOSIS — O368130 Decreased fetal movements, third trimester, not applicable or unspecified: Secondary | ICD-10-CM | POA: Diagnosis not present

## 2017-12-20 DIAGNOSIS — O368190 Decreased fetal movements, unspecified trimester, not applicable or unspecified: Secondary | ICD-10-CM | POA: Diagnosis present

## 2017-12-20 DIAGNOSIS — O368120 Decreased fetal movements, second trimester, not applicable or unspecified: Secondary | ICD-10-CM | POA: Diagnosis present

## 2017-12-20 MED ORDER — OXYCODONE-ACETAMINOPHEN 5-325 MG PO TABS: 1.0000 | ORAL_TABLET | ORAL | Status: DC | PRN

## 2017-12-20 MED ORDER — ACETAMINOPHEN 325 MG PO TABS: 650.0000 mg | ORAL_TABLET | ORAL | Status: DC | PRN

## 2017-12-20 NOTE — OB Triage Note (Signed)
Patient states that she has not felt any fetal movement since last night at 10pm. She has no other complaints denies contractions, bleeding and LOF.

## 2017-12-20 NOTE — OB Triage Note (Signed)
Ebony [name] is a 22 y.o. female. She is at 6057w0d gestation. Patient's last menstrual period was 07/04/2017 (approximate). Estimated Date of Delivery: 04/04/18  [facility]    Chief complaint:decreased fetal movement today  S: Resting comfortably. no CTX, no VB.no LOF,  Has not felt the baby since last pm.   Maternal Medical History:   Past Medical History:  Diagnosis Date  . Acne   . Eczema, allergic   . History of tonsillectomy     Past Surgical History:  Procedure Laterality Date  . ADENOIDECTOMY    . DILATION AND EVACUATION N/A 05/29/2017   Procedure: DILATATION AND EVACUATION;  Surgeon: Conard NovakJackson, Stephen D, MD;  Location: [facility] ORS;  Service: Gynecology;  Laterality: N/A;  . TONSILLECTOMY    . WISDOM TOOTH EXTRACTION      No Known Allergies  Prior to Admission medications   Medication Sig Start Date End Date Taking? Authorizing Provider  amoxicillin (AMOXIL) 500 MG capsule Take 1 capsule (500 mg total) by mouth 3 (three) times daily. Patient not taking: Reported on 12/20/2017 10/07/17   Menshew, Charlesetta IvoryJenise V Bacon, PA-C  benzonatate (TESSALON PERLES) 100 MG capsule Take 1-2 tabs TID prn cough Patient not taking: Reported on 12/20/2017 07/16/17   Menshew, Charlesetta IvoryJenise V Bacon, PA-C  ondansetron (ZOFRAN ODT) 4 MG disintegrating tablet Take 1 tablet (4 mg total) by mouth every 8 (eight) hours as needed for nausea or vomiting. 06/26/17   Tommi RumpsSummers, Rhonda L, PA-C     Social History: She  reports that she quit smoking about 8 months ago. Her smoking use included cigarettes. She quit after 1.00 year of use. She has never used smokeless tobacco. She reports that she has current or past drug history. Drug: Marijuana. Frequency: 2.00 times per week. She reports that she does not drink alcohol.  Family History: family history includes Diabetes in her brother. no history of gyn cancers  Review of Systems: A full review of systems was performed and negative except as noted  in the HPI.     O:  BP 104/62   Pulse 71   Temp 98.2 F (36.8 C) (Oral)   LMP 07/04/2017 (Approximate)  No results found for this or any previous visit (from the past 48 hour(s)).   Constitutional: NAD, AAOx3  HE/ENT: extraocular movements grossly intact, moist mucous membranes CV: RRR PULM: nl respiratory effort, CTABL     Abd: gravid, non-tender, non-distended, soft      Ext: Non-tender, Nonedmeatous   Psych: mood appropriate, speech normal Pelvic:closed/0%/?presenting part LABS: 09/05/2017   Needed Type Check  A POS  NEG  HEPATITIS  09/05/2017   Nonreactive  Hepatitis B Surface Ag   HIV NR, Rubella Neg?, Varicella not found Genetic: Neg CF Aneuploidy screening/ testing: too far along for first trimester screen. Declined cell-free today.   Carrier screening:  [x]  Cystic fibrosis - negative x 60 mutations [x]  Spinal muscular atrophy - 2 copies SMN1; linked variant not present; reduced carrier risk [x]  Hemoglobinopathy screening - normal adult hemoglobin FHR:140's with Doppler as fetal tracing cannot be traced due to prematurity  A/P: 22 y.o. 1757w0d here for antenatal surveillance for decreased FM.   Labor: not present. Initially had UC"s q 3-5 mns and now there are non present  Fetal Wellbeing: Reassuring Cat 1 tracing.  D/c home stable, precautions reviewed, follow-up as scheduled.   ----- Myrtie Cruisearon W. Jones,RN, MSN, CNM, FNP Certified Nurse Midwife [facility]

## 2018-01-07 ENCOUNTER — Other Ambulatory Visit: Payer: Self-pay

## 2018-01-07 ENCOUNTER — Emergency Department
Admission: EM | Admit: 2018-01-07 | Discharge: 2018-01-07 | Disposition: A | Discharge status: Home / Self Care | Payer: Medicaid Other | Attending: Student in an Organized Health Care Education/Training Program | Admitting: Student in an Organized Health Care Education/Training Program

## 2018-01-07 ENCOUNTER — Encounter: Payer: Self-pay | Admitting: Emergency Medicine

## 2018-01-07 DIAGNOSIS — Z87891 Personal history of nicotine dependence: Secondary | ICD-10-CM | POA: Insufficient documentation

## 2018-01-07 DIAGNOSIS — I781 Nevus, non-neoplastic: Secondary | ICD-10-CM | POA: Diagnosis not present

## 2018-01-07 DIAGNOSIS — R21 Rash and other nonspecific skin eruption: Secondary | ICD-10-CM | POA: Diagnosis present

## 2018-01-07 NOTE — ED Triage Notes (Addendum)
Patient ambulatory to triage with steady gait, without difficulty or distress noted; pt reports onset itchy rash to face this evening with no known cause; pt [redacted]wks pregnant

## 2018-01-08 NOTE — ED Provider Notes (Signed)
[facility] Emergency Department Provider Note  ____________________________________________  Time seen: Approximately 12:00 AM  I have reviewed the triage vital signs and the nursing notes.   HISTORY  Chief Complaint Rash    HPI Ebony [name] is a 22 y.o. female presents to the emergency department with broken capillaries of the face after an episode of vomiting that occurred tonight after patient ate.  Patient is [redacted] weeks pregnant and she reports that occasional vomiting is pretty normal for her pregnancy.  Patient denies current nausea, abdominal pain, vaginal bleeding, gush of vaginal fluids or other pregnancy concerns.  Patient denies fever, chills, rhinorrhea, congestion or nonproductive cough.  Patient has never seen broken capillaries on her face before became concerned.  No alleviating measures have been attempted.   Past Medical History:  Diagnosis Date  . Acne   . Eczema, allergic   . History of tonsillectomy     Patient Active Problem List   Diagnosis Date Noted  . Decreased fetal movement 12/20/2017  . Blighted ovum 05/27/2017  . Obesity, pediatric, BMI 95th to 98th percentile for age 35/15/2016  . Intermenstrual bleeding 12/19/2014  . Allergic contact dermatitis 12/17/2014    Past Surgical History:  Procedure Laterality Date  . ADENOIDECTOMY    . DILATION AND EVACUATION N/A 05/29/2017   Procedure: DILATATION AND EVACUATION;  Surgeon: Conard Novak, MD;  Location: [facility] ORS;  Service: Gynecology;  Laterality: N/A;  . TONSILLECTOMY    . WISDOM TOOTH EXTRACTION      Prior to Admission medications   Medication Sig Start Date End Date Taking? Authorizing Provider  amoxicillin (AMOXIL) 500 MG capsule Take 1 capsule (500 mg total) by mouth 3 (three) times daily. Patient not taking: Reported on 12/20/2017 10/07/17   Menshew, Charlesetta Ivory, PA-C  benzonatate (TESSALON PERLES) 100 MG capsule Take 1-2 tabs TID prn cough Patient not  taking: Reported on 12/20/2017 07/16/17   Menshew, Charlesetta Ivory, PA-C  ondansetron (ZOFRAN ODT) 4 MG disintegrating tablet Take 1 tablet (4 mg total) by mouth every 8 (eight) hours as needed for nausea or vomiting. 06/26/17   Tommi Rumps, PA-C    Allergies Patient has no known allergies.  Family History  Problem Relation Age of Onset  . Diabetes Brother     Social History Social History   Tobacco Use  . Smoking status: Former Smoker    Years: 1.00    Types: Cigarettes    Last attempt to quit: 03/28/2017    Years since quitting: 0.7  . Smokeless tobacco: Never Used  . Tobacco comment: 1-2 CIG /WEEK  Substance Use Topics  . Alcohol use: No    Alcohol/week: 0.0 standard drinks  . Drug use: Not Currently    Frequency: 2.0 times per week    Types: Marijuana     Review of Systems  Constitutional: No fever/chills Eyes: No visual changes. No discharge ENT: No upper respiratory complaints. Cardiovascular: no chest pain. Respiratory: no cough. No SOB. Gastrointestinal: No abdominal pain.  No nausea, no vomiting.  No diarrhea.  No constipation. Musculoskeletal: Negative for musculoskeletal pain. Skin: Patient has broken capillaries of face. Neurological: Negative for headaches, focal weakness or numbness.   ____________________________________________   PHYSICAL EXAM:  VITAL SIGNS: ED Triage Vitals  Enc Vitals Group     BP 01/07/18 2136 113/69     Pulse Rate 01/07/18 2136 (!) 118     Resp 01/07/18 2136 18     Temp 01/07/18 2136 98  F (36.7 C)     Temp Source 01/07/18 2136 Oral     SpO2 01/07/18 2136 99 %     Weight 01/07/18 2137 179 lb (81.2 kg)     Height 01/07/18 2137 5\' 6"  (1.676 m)     Head Circumference --      Peak Flow --      Pain Score 01/07/18 2136 0     Pain Loc --      Pain Edu? --      Excl. in GC? --      Constitutional: Alert and oriented. Well appearing and in no acute distress. Eyes: Conjunctivae are normal. PERRL. EOMI. Head:  Atraumatic. ENT:      Ears: TMs are pearly.      Nose: No congestion/rhinnorhea.      Mouth/Throat: Mucous membranes are moist.  Neck: No stridor.  No cervical spine tenderness to palpation. Cardiovascular: Normal rate, regular rhythm. Normal S1 and S2.  Good peripheral circulation. Respiratory: Normal respiratory effort without tachypnea or retractions. Lungs CTAB. Good air entry to the bases with no decreased or absent breath sounds. Gastrointestinal: Bowel sounds 4 quadrants. Soft and nontender to palpation. No guarding or rigidity. No palpable masses. No distention. No CVA tenderness. Musculoskeletal: Full range of motion to all extremities. No gross deformities appreciated. Neurologic:  Normal speech and language. No gross focal neurologic deficits are appreciated.  Skin: Patient has broken capillaries of face. Psychiatric: Mood and affect are normal. Speech and behavior are normal. Patient exhibits appropriate insight and judgement.   ____________________________________________   LABS (all labs ordered are listed, but only abnormal results are displayed)  Labs Reviewed - No data to display ____________________________________________  EKG   ____________________________________________  RADIOLOGY   No results found.  ____________________________________________    PROCEDURES  Procedure(s) performed:    Procedures    Medications - No data to display   ____________________________________________   INITIAL IMPRESSION / ASSESSMENT AND PLAN / ED COURSE  Pertinent labs & imaging results that were available during my care of the patient were reviewed by me and considered in my medical decision making (see chart for details).  Review of the [place] CSRS was performed in accordance of the [facility] prior to dispensing any controlled drugs.      Assessment and plan Telangiectasia Patient presents to the emergency department with broken capillaries of the face that  occurred after vomiting, consistent with telangiectasias.  Patient education regarding telangiectasias were given to patient.  Fetal heart tones were assessed in the emergency department were reassuring.  Vital signs are reassuring prior to discharge.  All patient questions were answered.    ____________________________________________  FINAL CLINICAL IMPRESSION(S) / ED DIAGNOSES  Final diagnoses:  Telangiectasia      NEW MEDICATIONS STARTED DURING THIS VISIT:  ED Discharge Orders    None          This chart was dictated using voice recognition software/Dragon. Despite best efforts to proofread, errors can occur which can change the meaning. Any change was purely unintentional.    Orvil Feil, PA-C 01/08/18 Ivor Reining    Willy Eddy, MD 01/08/18 [phone]

## 2018-02-09 ENCOUNTER — Encounter: Payer: Self-pay | Admitting: Emergency Medicine

## 2018-02-09 ENCOUNTER — Emergency Department
Admission: EM | Admit: 2018-02-09 | Discharge: 2018-02-09 | Disposition: A | Discharge status: Home / Self Care | Payer: Medicaid Other | Attending: Emergency Medicine | Admitting: Emergency Medicine

## 2018-02-09 ENCOUNTER — Other Ambulatory Visit: Payer: Self-pay

## 2018-02-09 DIAGNOSIS — Z87891 Personal history of nicotine dependence: Secondary | ICD-10-CM | POA: Diagnosis not present

## 2018-02-09 DIAGNOSIS — O9989 Other specified diseases and conditions complicating pregnancy, childbirth and the puerperium: Secondary | ICD-10-CM | POA: Insufficient documentation

## 2018-02-09 DIAGNOSIS — J329 Chronic sinusitis, unspecified: Secondary | ICD-10-CM | POA: Diagnosis not present

## 2018-02-09 DIAGNOSIS — B9789 Other viral agents as the cause of diseases classified elsewhere: Secondary | ICD-10-CM | POA: Insufficient documentation

## 2018-02-09 DIAGNOSIS — Z3A31 31 weeks gestation of pregnancy: Secondary | ICD-10-CM | POA: Insufficient documentation

## 2018-02-09 MED ORDER — SALINE SPRAY 0.65 % NA SOLN: 1.0000 | NASAL | 0 refills | Status: DC | PRN

## 2018-02-09 NOTE — Discharge Instructions (Addendum)
Please keep your appointment tomorrow with OB.  Take Tylenol as needed for symptoms.

## 2018-02-09 NOTE — ED Notes (Signed)
See triage note   Presents with cough and nasal congestion for 3 days  Denies any fever   States she has been using Robitussin for cough w/o relief  Pt is 31 weeks preg  Denies any vaginal bleeding or concerns  She is having her OB care at [facility]

## 2018-02-09 NOTE — ED Provider Notes (Signed)
[facility] - Inpatient Emergency Department Provider Note  ____________________________________________  Time seen: Approximately 8:58 AM  I have reviewed the triage vital signs and the nursing notes.   HISTORY  Chief Complaint Nasal Congestion    HPI Ebony [name] is a 22 y.o. female that presents emergency department for evaluation of 3 to 4 days of nasal congestion.  She coughed about 2 times this morning.  She has been taking Robitussin, without relief.  Patient is 31-week pregnancy and denies any pregnancy complications or concerns.  She she does OB care at [facility] and has an appointment tomorrow morning.  No fever, chills, shortness of breath.   Past Medical History:  Diagnosis Date  . Acne   . Eczema, allergic   . History of tonsillectomy     Patient Active Problem List   Diagnosis Date Noted  . Decreased fetal movement 12/20/2017  . Blighted ovum 05/27/2017  . Obesity, pediatric, BMI 95th to 98th percentile for age 88/15/2016  . Intermenstrual bleeding 12/19/2014  . Allergic contact dermatitis 12/17/2014    Past Surgical History:  Procedure Laterality Date  . ADENOIDECTOMY    . DILATION AND EVACUATION N/A 05/29/2017   Procedure: DILATATION AND EVACUATION;  Surgeon: Conard Novak, MD;  Location: [facility] ORS;  Service: Gynecology;  Laterality: N/A;  . TONSILLECTOMY    . WISDOM TOOTH EXTRACTION      Prior to Admission medications   Medication Sig Start Date End Date Taking? Authorizing Provider  amoxicillin (AMOXIL) 500 MG capsule Take 1 capsule (500 mg total) by mouth 3 (three) times daily. Patient not taking: Reported on 12/20/2017 10/07/17   Menshew, Charlesetta Ivory, PA-C  benzonatate (TESSALON PERLES) 100 MG capsule Take 1-2 tabs TID prn cough Patient not taking: Reported on 12/20/2017 07/16/17   Menshew, Charlesetta Ivory, PA-C  ondansetron (ZOFRAN ODT) 4 MG disintegrating tablet Take 1 tablet (4 mg total) by mouth every 8 (eight) hours as needed for  nausea or vomiting. 06/26/17   Tommi Rumps, PA-C  sodium chloride (OCEAN) 0.65 % SOLN nasal spray Place 1 spray into both nostrils as needed for congestion. 02/09/18   Enid Derry, PA-C    Allergies Patient has no known allergies.  Family History  Problem Relation Age of Onset  . Diabetes Brother     Social History Social History   Tobacco Use  . Smoking status: Former Smoker    Years: 1.00    Types: Cigarettes    Last attempt to quit: 03/28/2017    Years since quitting: 0.8  . Smokeless tobacco: Never Used  . Tobacco comment: 1-2 CIG /WEEK  Substance Use Topics  . Alcohol use: No    Alcohol/week: 0.0 standard drinks  . Drug use: Not Currently    Frequency: 2.0 times per week    Types: Marijuana     Review of Systems  Constitutional: No fever/chills Eyes: No visual changes. No discharge. ENT: Positive for congestion and rhinorrhea. Cardiovascular: No chest pain. Respiratory: No SOB. Gastrointestinal: No abdominal pain.  No nausea, no vomiting.  Musculoskeletal: Negative for musculoskeletal pain. Skin: Negative for rash, abrasions, lacerations, ecchymosis. Neurological: Negative for headaches.   ____________________________________________   PHYSICAL EXAM:  VITAL SIGNS: ED Triage Vitals  Enc Vitals Group     BP 02/09/18 0814 106/62     Pulse Rate 02/09/18 0814 83     Resp 02/09/18 0814 18     Temp 02/09/18 0814 (!) 97.5 F (36.4 C)     Temp  Source 02/09/18 0814 Oral     SpO2 02/09/18 0814 99 %     Weight 02/09/18 0815 173 lb (78.5 kg)     Height 02/09/18 0815 5\' 6"  (1.676 m)     Head Circumference --      Peak Flow --      Pain Score 02/09/18 0815 5     Pain Loc --      Pain Edu? --      Excl. in GC? --      Constitutional: Alert and oriented. Well appearing and in no acute distress. Eyes: Conjunctivae are normal. PERRL. EOMI. No discharge. Head: Atraumatic. ENT: No frontal and maxillary sinus tenderness.      Ears: Tympanic membranes  pearly gray with good landmarks. No discharge.      Nose: Mild congestion/rhinnorhea.      Mouth/Throat: Mucous membranes are moist. Oropharynx non-erythematous. Tonsils not enlarged. No exudates. Uvula midline. Neck: No stridor.   Hematological/Lymphatic/Immunilogical: No cervical lymphadenopathy. Cardiovascular: Normal rate, regular rhythm.  Good peripheral circulation. Respiratory: Normal respiratory effort without tachypnea or retractions. Lungs CTAB. Good air entry to the bases with no decreased or absent breath sounds. Gastrointestinal: Bowel sounds 4 quadrants. Soft and nontender to palpation. No guarding or rigidity. No palpable masses. No distention. Musculoskeletal: Full range of motion to all extremities. No gross deformities appreciated. Neurologic:  Normal speech and language. No gross focal neurologic deficits are appreciated.  Skin:  Skin is warm, dry and intact. No rash noted. Psychiatric: Mood and affect are normal. Speech and behavior are normal. Patient exhibits appropriate insight and judgement.   ____________________________________________   LABS (all labs ordered are listed, but only abnormal results are displayed)  Labs Reviewed - No data to display ____________________________________________  EKG   ____________________________________________  RADIOLOGY   No results found.  ____________________________________________    PROCEDURES  Procedure(s) performed:    Procedures    Medications - No data to display   ____________________________________________   INITIAL IMPRESSION / ASSESSMENT AND PLAN / ED COURSE  Pertinent labs & imaging results that were available during my care of the patient were reviewed by me and considered in my medical decision making (see chart for details).  Review of the [place] CSRS was performed in accordance of the [facility] prior to dispensing any controlled drugs.     Patient's diagnosis is consistent with viral  sinusitis. Vital signs and exam are reassuring. Patient appears well and is staying well hydrated. Patient feels comfortable going home. Patient will be discharged home with prescriptions for saline nose spray.  She will discontinue Robitussin. Patient is to follow up with Ob as needed or otherwise directed.  She has an appointment with OB tomorrow.  Patient is given ED precautions to return to the ED for any worsening or new symptoms.     ____________________________________________  FINAL CLINICAL IMPRESSION(S) / ED DIAGNOSES  Final diagnoses:  Viral sinusitis      NEW MEDICATIONS STARTED DURING THIS VISIT:  ED Discharge Orders         Ordered    sodium chloride (OCEAN) 0.65 % SOLN nasal spray  As needed     02/09/18 0900              This chart was dictated using voice recognition software/Dragon. Despite best efforts to proofread, errors can occur which can change the meaning. Any change was purely unintentional.    Enid Derry, PA-C 02/09/18 1558    Jene Every, MD 02/10/18 [phone]

## 2018-02-09 NOTE — ED Triage Notes (Signed)
Head congestion x 4 days. [redacted] weeks pregnant with no vag bleed, fluid leaking or contractions.

## 2018-05-10 ENCOUNTER — Encounter: Payer: Self-pay | Admitting: Emergency Medicine

## 2018-05-10 ENCOUNTER — Other Ambulatory Visit: Payer: Self-pay

## 2018-05-10 ENCOUNTER — Emergency Department
Admission: EM | Admit: 2018-05-10 | Discharge: 2018-05-10 | Disposition: A | Discharge status: Home / Self Care | Payer: Medicaid Other | Attending: Emergency Medicine | Admitting: Emergency Medicine

## 2018-05-10 DIAGNOSIS — R51 Headache: Secondary | ICD-10-CM | POA: Diagnosis present

## 2018-05-10 DIAGNOSIS — Z87891 Personal history of nicotine dependence: Secondary | ICD-10-CM | POA: Diagnosis not present

## 2018-05-10 DIAGNOSIS — B349 Viral infection, unspecified: Secondary | ICD-10-CM | POA: Diagnosis not present

## 2018-05-10 NOTE — ED Provider Notes (Signed)
[facility] Emergency Department Provider Note   ____________________________________________    I have reviewed the triage vital signs and the nursing notes.   HISTORY  Chief Complaint Generalized Body Aches and Headache     HPI Ebony [name] is a 23 y.o. female who complains of mild body aches and mild headache, intermittent, over the last 2 days.  Patient reports she has a 56-month-old at home and wants to make sure she is not getting sick.  Denies nausea vomiting.  No chest pain, no cough, no fevers.  No abdominal pain.  No recent travel.  No rash.  Is not breast-feeding   Past Medical History:  Diagnosis Date  . Acne   . Eczema, allergic   . History of tonsillectomy     Patient Active Problem List   Diagnosis Date Noted  . Decreased fetal movement 12/20/2017  . Blighted ovum 05/27/2017  . Obesity, pediatric, BMI 95th to 98th percentile for age 60/15/2016  . Intermenstrual bleeding 12/19/2014  . Allergic contact dermatitis 12/17/2014    Past Surgical History:  Procedure Laterality Date  . ADENOIDECTOMY    . DILATION AND EVACUATION N/A 05/29/2017   Procedure: DILATATION AND EVACUATION;  Surgeon: Conard Novak, MD;  Location: [facility] ORS;  Service: Gynecology;  Laterality: N/A;  . TONSILLECTOMY    . WISDOM TOOTH EXTRACTION      Prior to Admission medications   Medication Sig Start Date End Date Taking? Authorizing Provider  amoxicillin (AMOXIL) 500 MG capsule Take 1 capsule (500 mg total) by mouth 3 (three) times daily. Patient not taking: Reported on 12/20/2017 10/07/17   Menshew, Charlesetta Ivory, PA-C  benzonatate (TESSALON PERLES) 100 MG capsule Take 1-2 tabs TID prn cough Patient not taking: Reported on 12/20/2017 07/16/17   Menshew, Charlesetta Ivory, PA-C  ondansetron (ZOFRAN ODT) 4 MG disintegrating tablet Take 1 tablet (4 mg total) by mouth every 8 (eight) hours as needed for nausea or vomiting. 06/26/17   Tommi Rumps, PA-C    sodium chloride (OCEAN) 0.65 % SOLN nasal spray Place 1 spray into both nostrils as needed for congestion. 02/09/18   Enid Derry, PA-C     Allergies Patient has no known allergies.  Family History  Problem Relation Age of Onset  . Diabetes Brother     Social History Social History   Tobacco Use  . Smoking status: Former Smoker    Years: 1.00    Types: Cigarettes    Last attempt to quit: 03/28/2017    Years since quitting: 1.1  . Smokeless tobacco: Never Used  . Tobacco comment: 1-2 CIG /WEEK  Substance Use Topics  . Alcohol use: No    Alcohol/week: 0.0 standard drinks  . Drug use: Not Currently    Frequency: 2.0 times per week    Types: Marijuana    Review of Systems  Constitutional: As above  ENT: No sore throat.   Gastrointestinal: As above Genitourinary: Negative for dysuria. Musculoskeletal: Myalgias as above Skin: Negative for rash. Neurological: Negative for headaches     ____________________________________________   PHYSICAL EXAM:  VITAL SIGNS: ED Triage Vitals [05/10/18 0854]  Enc Vitals Group     BP 115/74     Pulse Rate 91     Resp 16     Temp 98 F (36.7 C)     Temp Source Oral     SpO2 98 %     Weight 68 kg (150 lb)  Height 1.676 m (5\' 6" )     Head Circumference      Peak Flow      Pain Score 6     Pain Loc      Pain Edu?      Excl. in GC?      Constitutional: Alert and oriented.  Eyes: Conjunctivae are normal.   Nose: No congestion/rhinnorhea. Mouth/Throat: Mucous membranes are moist.   Cardiovascular: Normal rate, regular rhythm.  Respiratory: Normal respiratory effort.  No retractions. Genitourinary: deferred Musculoskeletal: No lower extremity tenderness nor edema.   Neurologic:  Normal speech and language. No gross focal neurologic deficits are appreciated.   Skin:  Skin is warm, dry and intact. No rash noted.   ____________________________________________   LABS (all labs ordered are listed, but only  abnormal results are displayed)  Labs Reviewed - No data to display ____________________________________________  EKG   ____________________________________________  RADIOLOGY  None ____________________________________________   PROCEDURES  Procedure(s) performed: No  Procedures   Critical Care performed: No ____________________________________________   INITIAL IMPRESSION / ASSESSMENT AND PLAN / ED COURSE  Pertinent labs & imaging results that were available during my care of the patient were reviewed by me and considered in my medical decision making (see chart for details).  Overall well-appearing in no acute distress, suspect early viral illness, discussed with patient no way preventing this.  Recommend supportive care, outpatient follow-up as needed.   ____________________________________________   FINAL CLINICAL IMPRESSION(S) / ED DIAGNOSES  Final diagnoses:  Viral syndrome      NEW MEDICATIONS STARTED DURING THIS VISIT:  Discharge Medication List as of 05/10/2018  9:53 AM       Note:  This document was prepared using Dragon voice recognition software and may include unintentional dictation errors.   Jene Every, MD 05/10/18 1204

## 2018-05-10 NOTE — ED Triage Notes (Signed)
Pt to ED via POV c/o generalized body aches and headache. Pt states that she has had her sx's x 2 days. Pt denies cough, chills, fever, N/V/D. Pt is in NAD at this time.

## 2018-05-10 NOTE — ED Notes (Signed)
Pt states she has had headache and generalized body aches for 2 days. Denies weakness or lightheaded or feeling any other sick symptom.

## 2018-09-01 IMAGING — US US OB TRANSVAGINAL
1 series · 13 of 28 positions shown · non-contrast
Comparison: None.

CLINICAL DATA: Pelvic pain and vaginal pain since D and C on
05/29/2017. Quantitative beta HCG is [DATE]. Positive urine pregnancy
test. Estimated gestational age by LMP is 10 weeks 3 days.

EXAM:
OBSTETRIC <14 WK US AND TRANSVAGINAL OB US
TECHNIQUE: Both transabdominal and transvaginal ultrasound examinations were
performed for complete evaluation of the gestation as well as the
maternal uterus, adnexal regions, and pelvic cul-de-sac.
Transvaginal technique was performed to assess early pregnancy.

[Series 1: us ob transvaginal · 0.22mm/px · 13 of 103 slices shown]
[im 4/103]
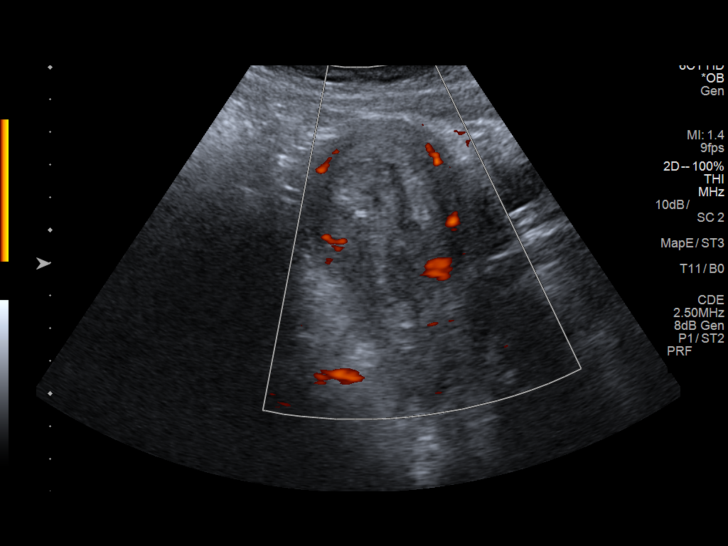
[im 12/103]
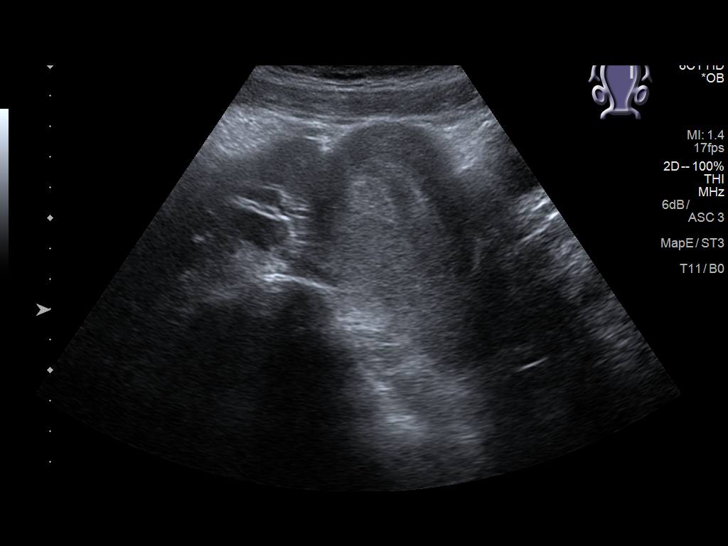
[im 19/103]
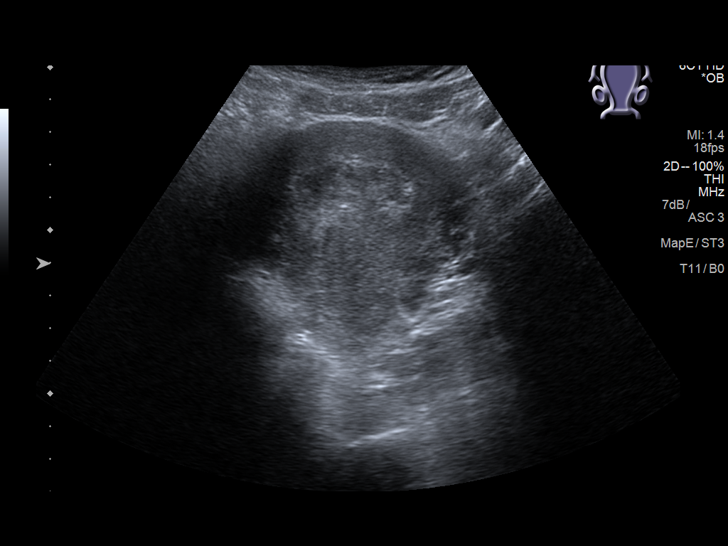
[im 27/103]
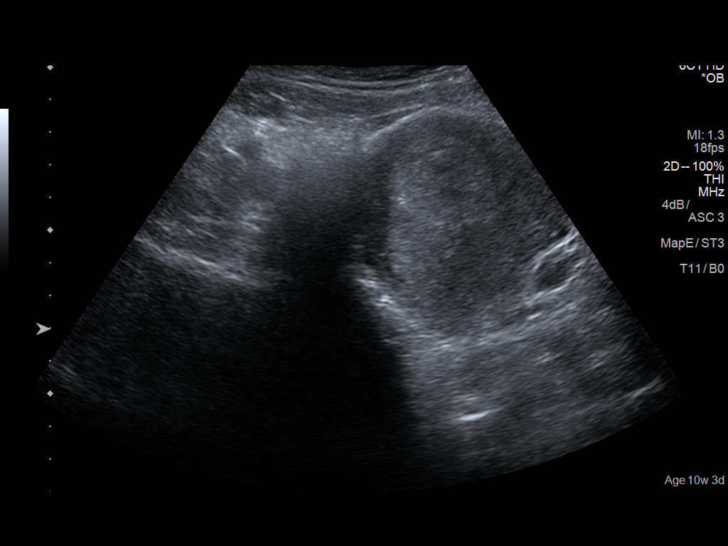
[im 35/103]
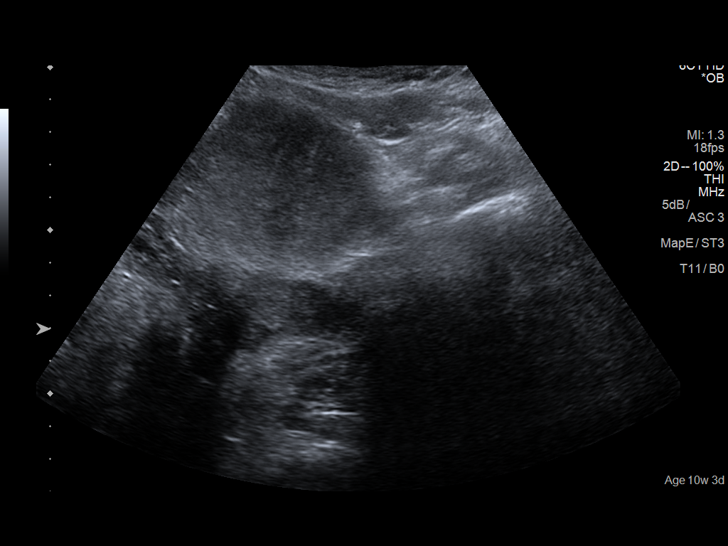
[im 42/103]
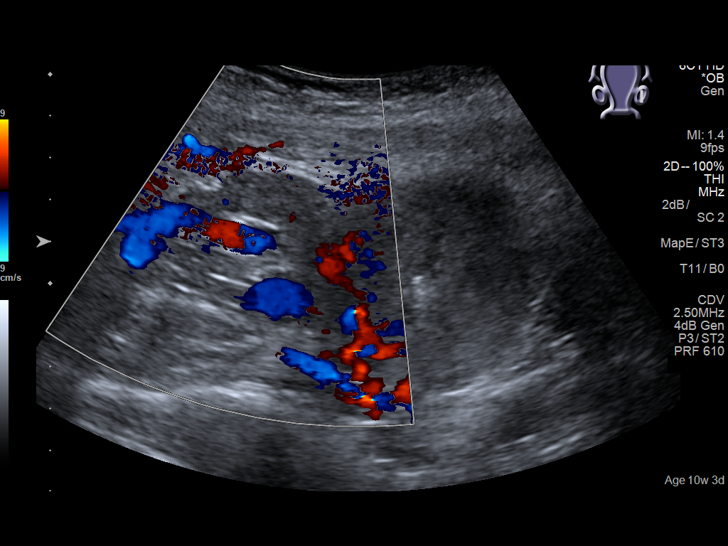
[im 53/103]
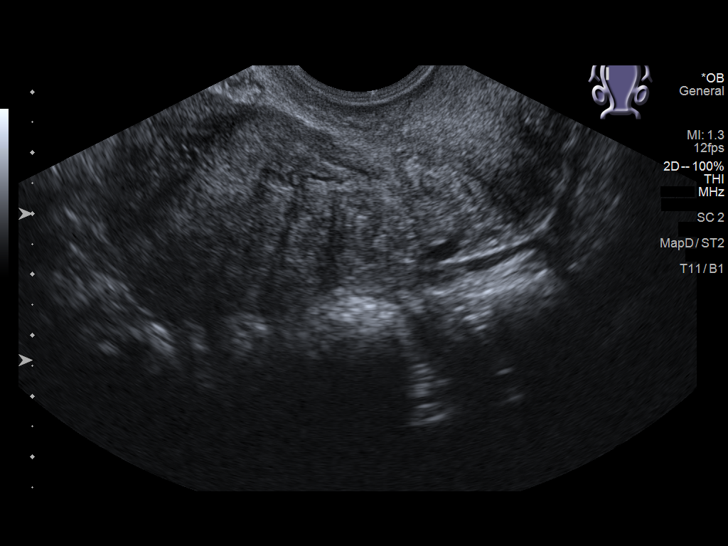
[im 61/103]
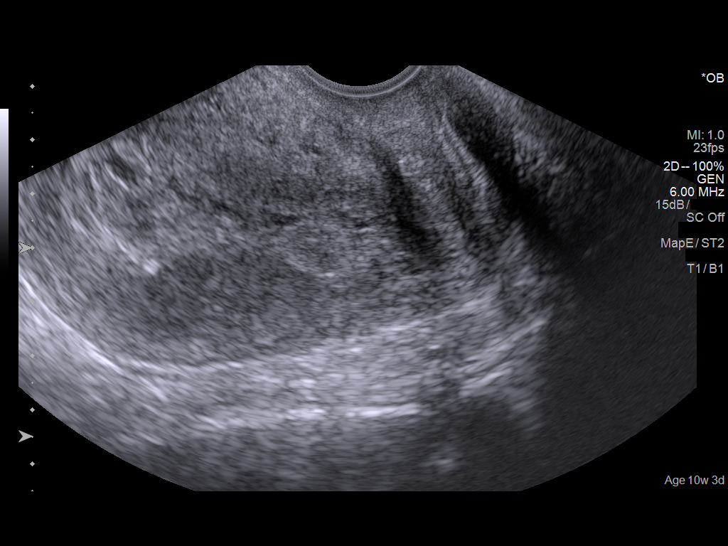
[im 69/103]
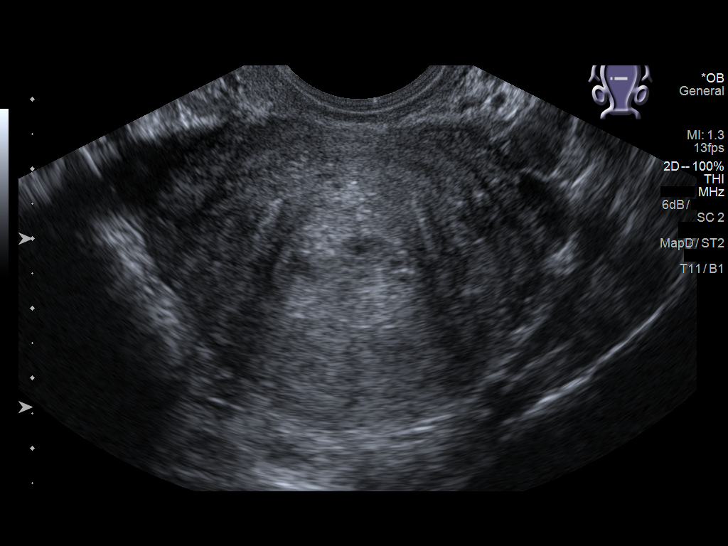
[im 76/103]
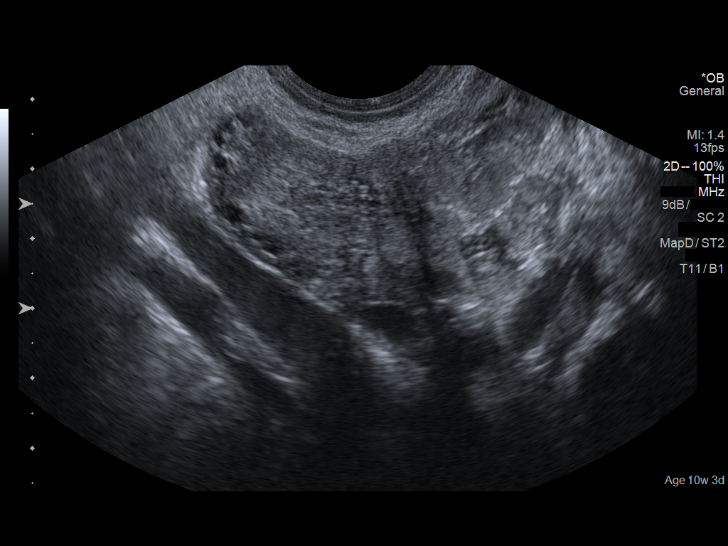
[im 84/103]
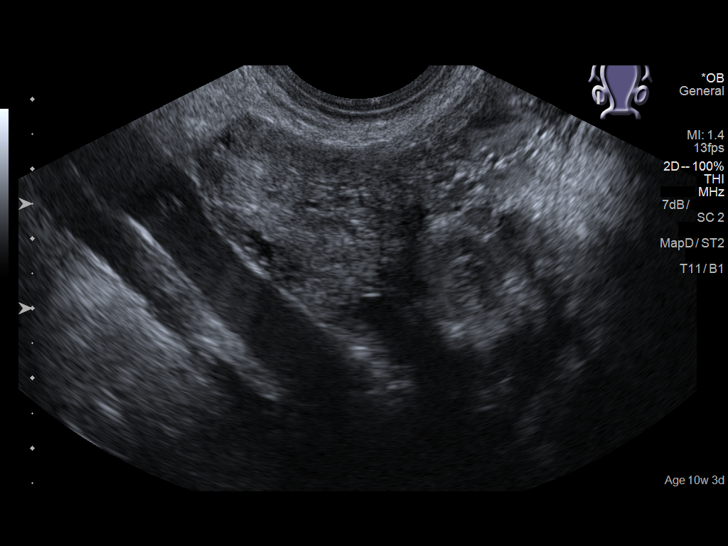
[im 91/103]
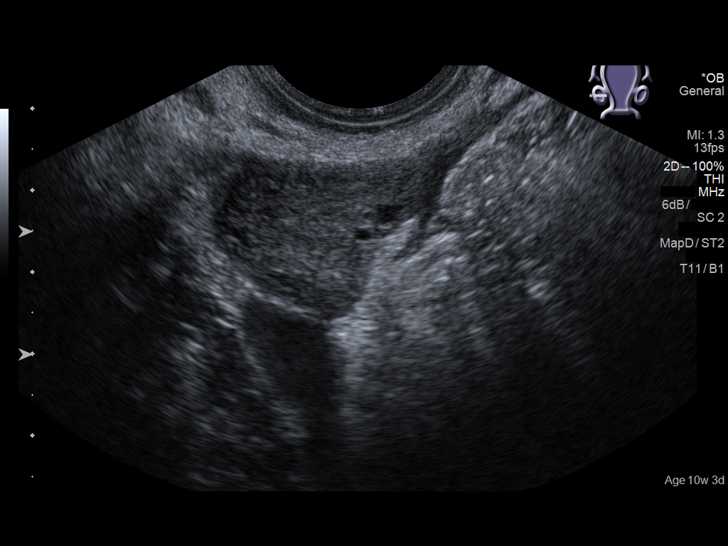
[im 99/103]
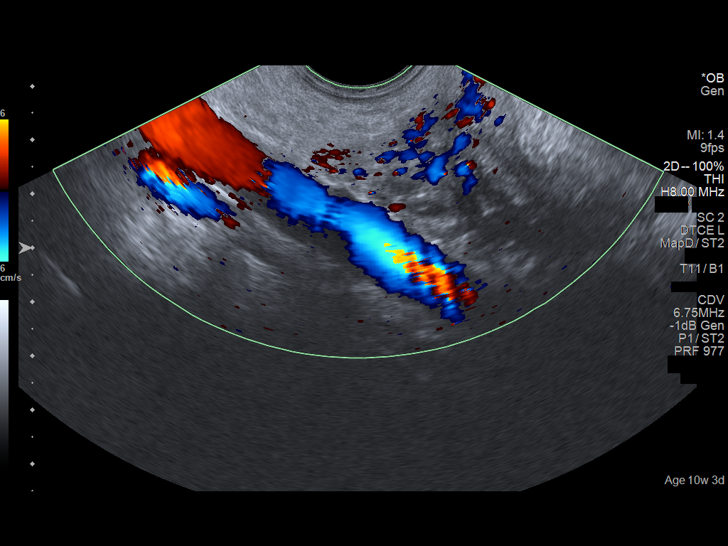

[13 of 28 positions shown; findings below may reference images not displayed]

FINDINGS: Intrauterine gestational sac: None

Yolk sac:  Not Visualized.

Embryo:  Not Visualized.

Cardiac Activity: Not Visualized.

Maternal uterus/adnexae: No myometrial mass lesions are demonstrated
in the uterus. The endometrium is heterogeneous and somewhat
expanded with mixed hypoechoic and hyperechoic material. Endometrial
stripe thickness measures 2.5 cm in diameter. No flow is
demonstrated in the endometrium on color flow Doppler imaging. Both
ovaries are visualized and appear normal. No abnormal pelvic fluid.
IMPRESSION: 1. No intrauterine gestational sac identified.
2. Heterogeneous expansion of the endometrium at 2.5 cm diameter.
This could represent blood clots or retained products of conception.

## 2019-09-14 ENCOUNTER — Ambulatory Visit: Payer: Self-pay | Admitting: Internal Medicine

## 2019-11-03 ENCOUNTER — Ambulatory Visit: Payer: Medicaid Other | Admitting: Obstetrics and Gynecology

## 2019-12-06 ENCOUNTER — Emergency Department: Payer: Medicaid Other

## 2019-12-06 ENCOUNTER — Other Ambulatory Visit: Payer: Self-pay

## 2019-12-06 ENCOUNTER — Encounter: Payer: Self-pay | Admitting: Emergency Medicine

## 2019-12-06 ENCOUNTER — Emergency Department
Admission: EM | Admit: 2019-12-06 | Discharge: 2019-12-06 | Disposition: A | Discharge status: Home / Self Care | Payer: Medicaid Other | Attending: Emergency Medicine | Admitting: Emergency Medicine

## 2019-12-06 DIAGNOSIS — Y998 Other external cause status: Secondary | ICD-10-CM | POA: Diagnosis not present

## 2019-12-06 DIAGNOSIS — S99912A Unspecified injury of left ankle, initial encounter: Secondary | ICD-10-CM | POA: Diagnosis present

## 2019-12-06 DIAGNOSIS — Z87891 Personal history of nicotine dependence: Secondary | ICD-10-CM | POA: Insufficient documentation

## 2019-12-06 DIAGNOSIS — F1210 Cannabis abuse, uncomplicated: Secondary | ICD-10-CM | POA: Diagnosis not present

## 2019-12-06 DIAGNOSIS — Y9301 Activity, walking, marching and hiking: Secondary | ICD-10-CM | POA: Insufficient documentation

## 2019-12-06 DIAGNOSIS — S93432A Sprain of tibiofibular ligament of left ankle, initial encounter: Secondary | ICD-10-CM | POA: Insufficient documentation

## 2019-12-06 DIAGNOSIS — X58XXXA Exposure to other specified factors, initial encounter: Secondary | ICD-10-CM | POA: Insufficient documentation

## 2019-12-06 DIAGNOSIS — Y92017 Garden or yard in single-family (private) house as the place of occurrence of the external cause: Secondary | ICD-10-CM | POA: Diagnosis not present

## 2019-12-06 DIAGNOSIS — S93492A Sprain of other ligament of left ankle, initial encounter: Secondary | ICD-10-CM

## 2019-12-06 MED ORDER — IBUPROFEN 600 MG PO TABS: 600.0000 mg | ORAL_TABLET | Freq: Three times a day (TID) | ORAL | 0 refills | Status: DC | PRN

## 2019-12-06 MED ORDER — IBUPROFEN 800 MG PO TABS
800.0000 mg | ORAL_TABLET | Freq: Once | ORAL | Status: AC
  Administered 2019-12-06: 800 mg via ORAL
  Filled 2019-12-06: qty 1

## 2019-12-06 NOTE — ED Notes (Signed)
Pt states she is not filing WC for ankle injury.

## 2019-12-06 NOTE — ED Triage Notes (Signed)
Pt reports swelling to her left ankle since this am. Pt reports no obvious injury but thinks she may have hurt it at work.

## 2019-12-06 NOTE — Discharge Instructions (Signed)
You were seen today for left ankle pain and swelling.  Your x-ray was negative for acute fracture.  You have been diagnosed with a left ankle sprain.  We have placed you in a Ace wrap and an air crest for comfort.  Please wear this during the day and take off at night.  I am giving a prescription for ibuprofen to take every 8 hours as needed with food.  Elevate your leg to help reduce pain and swelling.  Work note provided.

## 2019-12-06 NOTE — ED Provider Notes (Signed)
[facility] Emergency Department Provider Note ____________________________________________  Time seen: 1920  I have reviewed the triage vital signs and the nursing notes.  HISTORY  Chief Complaint  Ankle Pain   HPI Ebony [name] is a 24 y.o. female presents to the clinic today with c/o left ankle pain and swelling. This started this morning. She describes the pain as dull, intermittent. The pain does not radiate. She denies numbness, tingling or weakness of the LLE. She reports she rolled her ankle while walking in the yard yesterday. She has not tried ice or any medications OTC.  Past Medical History:  Diagnosis Date  . Acne   . Eczema, allergic   . History of tonsillectomy     Patient Active Problem List   Diagnosis Date Noted  . Decreased fetal movement 12/20/2017  . Blighted ovum 05/27/2017  . Obesity, pediatric, BMI 95th to 98th percentile for age 39/15/2016  . Intermenstrual bleeding 12/19/2014  . Allergic contact dermatitis 12/17/2014    Past Surgical History:  Procedure Laterality Date  . ADENOIDECTOMY    . DILATION AND EVACUATION N/A 05/29/2017   Procedure: DILATATION AND EVACUATION;  Surgeon: Conard Novak, MD;  Location: [facility] ORS;  Service: Gynecology;  Laterality: N/A;  . TONSILLECTOMY    . WISDOM TOOTH EXTRACTION      Prior to Admission medications   Medication Sig Start Date End Date Taking? Authorizing Provider  ibuprofen (ADVIL) 600 MG tablet Take 1 tablet (600 mg total) by mouth every 8 (eight) hours as needed. 12/06/19   Lorre Munroe, NP  ondansetron (ZOFRAN ODT) 4 MG disintegrating tablet Take 1 tablet (4 mg total) by mouth every 8 (eight) hours as needed for nausea or vomiting. 06/26/17   Tommi Rumps, PA-C  sodium chloride (OCEAN) 0.65 % SOLN nasal spray Place 1 spray into both nostrils as needed for congestion. 02/09/18   Enid Derry, PA-C    Allergies Patient has no known allergies.  Family History   Problem Relation Age of Onset  . Diabetes Brother     Social History Social History   Tobacco Use  . Smoking status: Former Smoker    Years: 1.00    Types: Cigarettes    Quit date: 03/28/2017    Years since quitting: 2.6  . Smokeless tobacco: Never Used  . Tobacco comment: 1-2 CIG /WEEK  Vaping Use  . Vaping Use: Never used  Substance Use Topics  . Alcohol use: No    Alcohol/week: 0.0 standard drinks  . Drug use: Not Currently    Frequency: 2.0 times per week    Types: Marijuana    Review of Systems  Constitutional: Negative for fever, chills or body aches. Cardiovascular: Negative for chest pain or chest tightness.Marland Kitchen Respiratory: Negative for cough or shortness of breath. Musculoskeletal: Positive for left ankle pain and swelling. Negative for knee or back pain.  Skin: Negative for redness or warmth. Neurological: Negative for focal weakness,tingling  or numbness. ____________________________________________  PHYSICAL EXAM:  VITAL SIGNS: ED Triage Vitals  Enc Vitals Group     BP 12/06/19 1803 134/76     Pulse Rate 12/06/19 1803 93     Resp 12/06/19 1803 18     Temp 12/06/19 1803 98.7 F (37.1 C)     Temp Source 12/06/19 1803 Oral     SpO2 12/06/19 1803 100 %     Weight 12/06/19 1746 150 lb (68 kg)     Height 12/06/19 1746 5\' 6"  (1.676 m)  Head Circumference --      Peak Flow --      Pain Score 12/06/19 1746 5     Pain Loc --      Pain Edu? --      Excl. in GC? --     Constitutional: Alert and oriented. Well appearing and in no distress. Cardiovascular: Normal rate, regular rhythm. Pedal pulses  2+ bilaterally. Respiratory: Normal respiratory effort. No wheezes/rales/rhonchi. Musculoskeletal: Normal flexion, extension and rotation of the left ankle. 1+ swelling of the left ankle. Able to stand on heels and toes. Limping gait. Neurologic:  Normal speech and language. No gross focal neurologic deficits are appreciated. Skin:  Skin is warm, dry and  intact. No redness or warmth noted. ____________________________________________   RADIOLOGY  Imaging Orders     DG Ankle Complete Left IMPRESSION:  Nonspecific soft tissue swelling about the ankle. No acute displaced  fracture or dislocation.    ____________________________________________    INITIAL IMPRESSION / ASSESSMENT AND PLAN / ED COURSE  Left Ankle Pain and Swelling secondary to Ankle Sprain:  Xray left ankle negative Ibuprofen 800 mg PO x 1 Ace wrap and air cast applied Ankle exercises given Encouraged RICE therapy RX for Ibuprofen 600 mg TID prn ________________________________________________  FINAL CLINICAL IMPRESSION(S) / ED DIAGNOSES  Final diagnoses:  Sprain of anterior talofibular ligament of left ankle, initial encounter      Lorre Munroe, NP 12/06/19 1942    Minna Antis, MD 12/07/19 1941

## 2019-12-06 NOTE — ED Notes (Signed)
See triage note  Presents with swelling and pain to left ankle  Unsure if she hurt it at work  Good pulses  No deformity

## 2020-01-27 ENCOUNTER — Emergency Department
Admission: EM | Admit: 2020-01-27 | Discharge: 2020-01-27 | Discharge status: Against Medical Advice | Payer: Medicaid Other

## 2020-01-28 ENCOUNTER — Emergency Department
Admission: EM | Admit: 2020-01-28 | Discharge: 2020-01-28 | Disposition: A | Discharge status: Home / Self Care | Payer: Medicaid Other | Attending: Emergency Medicine | Admitting: Emergency Medicine

## 2020-01-28 ENCOUNTER — Other Ambulatory Visit: Payer: Self-pay

## 2020-01-28 DIAGNOSIS — Z87891 Personal history of nicotine dependence: Secondary | ICD-10-CM | POA: Diagnosis not present

## 2020-01-28 DIAGNOSIS — N898 Other specified noninflammatory disorders of vagina: Secondary | ICD-10-CM | POA: Insufficient documentation

## 2020-01-28 DIAGNOSIS — R102 Pelvic and perineal pain: Secondary | ICD-10-CM | POA: Diagnosis present

## 2020-01-28 LAB — WET PREP, GENITAL
Clue Cells Wet Prep HPF POC: NONE SEEN
Sperm: NONE SEEN
Trich, Wet Prep: NONE SEEN
Yeast Wet Prep HPF POC: NONE SEEN

## 2020-01-28 LAB — POCT PREGNANCY, URINE: Preg Test, Ur: NEGATIVE

## 2020-01-28 LAB — CHLAMYDIA/NGC RT PCR (ARMC ONLY)
Chlamydia Tr: NOT DETECTED
N gonorrhoeae: NOT DETECTED

## 2020-01-28 MED ORDER — DESONIDE 0.05 % EX CREA: TOPICAL_CREAM | CUTANEOUS | 1 refills | Status: DC

## 2020-01-28 NOTE — ED Notes (Signed)
Patient declined discharge vital signs. 

## 2020-01-28 NOTE — ED Triage Notes (Signed)
Pt comes via POV from home with c/o vaginal pain and discharge. Pt states this started Monday. Pt denies any odor. Pt states no itching.

## 2020-01-28 NOTE — Discharge Instructions (Signed)
Please follow up with the health department if not improving with time and medication.

## 2020-01-28 NOTE — ED Provider Notes (Signed)
[facility] [place] Emergency Department Provider Note  ____________________________________________  Time seen: Approximately 9:55 PM  I have reviewed the triage vital signs and the nursing notes.   HISTORY  Chief Complaint Vaginal Pain    HPI Ebony [name] is a 24 y.o. female presents to the emergency department for treatment and evaluation of vaginal "irritation.  Symptoms started 4 to 5 days ago.  She denies any concern for STD exposure.  She denies other or itching.  She states that the areas feels irritated.   Past Medical History:  Diagnosis Date   Acne    Eczema, allergic    History of tonsillectomy     Patient Active Problem List   Diagnosis Date Noted   Decreased fetal movement 12/20/2017   Blighted ovum 05/27/2017   Obesity, pediatric, BMI 95th to 98th percentile for age 07/19/2014   Intermenstrual bleeding 12/19/2014   Allergic contact dermatitis 12/17/2014    Past Surgical History:  Procedure Laterality Date   ADENOIDECTOMY     DILATION AND EVACUATION N/A 05/29/2017   Procedure: DILATATION AND EVACUATION;  Surgeon: Conard Novak, MD;  Location: [facility] ORS;  Service: Gynecology;  Laterality: N/A;   TONSILLECTOMY     WISDOM TOOTH EXTRACTION      Prior to Admission medications   Medication Sig Start Date End Date Taking? Authorizing Provider  ibuprofen (ADVIL) 600 MG tablet Take 1 tablet (600 mg total) by mouth every 8 (eight) hours as needed. 12/06/19   Lorre Munroe, NP  ondansetron (ZOFRAN ODT) 4 MG disintegrating tablet Take 1 tablet (4 mg total) by mouth every 8 (eight) hours as needed for nausea or vomiting. 06/26/17   Tommi Rumps, PA-C  sodium chloride (OCEAN) 0.65 % SOLN nasal spray Place 1 spray into both nostrils as needed for congestion. 02/09/18   Enid Derry, PA-C    Allergies Patient has no known allergies.  Family History  Problem Relation Age of Onset   Diabetes Brother     Social  History Social History   Tobacco Use   Smoking status: Former Smoker    Years: 1.00    Types: Cigarettes    Quit date: 03/28/2017    Years since quitting: 2.8   Smokeless tobacco: Never Used   Tobacco comment: 1-2 CIG /WEEK  Vaping Use   Vaping Use: Never used  Substance Use Topics   Alcohol use: Yes    Alcohol/week: 0.0 standard drinks    Comment: occ   Drug use: Not Currently    Frequency: 2.0 times per week    Review of Systems Constitutional: Negative for fever. Respiratory: Negative for shortness of breath or cough. Gastrointestinal: Negative for abdominal pain; negative for nausea , negative for vomiting. Genitourinary: Negative for dysuria , positive for vaginal discharge. Musculoskeletal: Negative for back pain. Skin: Negative for acute skin changes/rash/lesion. ____________________________________________   PHYSICAL EXAM:  VITAL SIGNS: ED Triage Vitals  Enc Vitals Group     BP 01/28/20 1652 (!) 142/83     Pulse Rate 01/28/20 1652 76     Resp 01/28/20 1652 18     Temp 01/28/20 1652 98.4 F (36.9 C)     Temp Source 01/28/20 1652 Oral     SpO2 01/28/20 1652 100 %     Weight 01/28/20 1652 206 lb (93.4 kg)     Height 01/28/20 1652 5\' 6"  (1.676 m)     Head Circumference --      Peak Flow --  Pain Score 01/28/20 1656 7     Pain Loc --      Pain Edu? --      Excl. in GC? --     Constitutional: Alert and oriented. Well appearing and in no acute distress. Eyes: Conjunctivae are normal. Head: Atraumatic. Nose: No congestion/rhinnorhea. Mouth/Throat: Mucous membranes are moist. Respiratory: Normal respiratory effort.  No retractions. Gastrointestinal: Bowel sounds active x 4; Abdomen is soft without rebound or guarding. Genitourinary: Pelvic exam: Scant amount of dark red blood in vaginal vault. Clear discharge. Musculoskeletal: No extremity tenderness nor edema.  Neurologic:  Normal speech and language. No gross focal neurologic deficits are  appreciated. Speech is normal. No gait instability. Skin:  Skin is warm, dry and intact. No rash noted on exposed skin. Psychiatric: Mood and affect are normal. Speech and behavior are normal.  ____________________________________________   LABS (all labs ordered are listed, but only abnormal results are displayed)  Labs Reviewed  CHLAMYDIA/NGC RT PCR ([facility] ONLY)  WET PREP, GENITAL  URINALYSIS, COMPLETE (UACMP) WITH MICROSCOPIC  POCT PREGNANCY, URINE  POC URINE PREG, ED   ____________________________________________  RADIOLOGY  Not indicated. ____________________________________________  Procedures  ____________________________________________  24 year old female presenting to the emergency department for treatment and evaluation of vaginal irritation and thin discharge. See HPI for further details.  Pelvic exam completed with no significant findings. Awaiting wet prep results.  Wet prep does not indicate vaginitis. Chlamydia and gonorrhea will result in a couple of hours. Patient states that she will use my chart to see the results. If she test positive for either of the 2 she is to go to the health department for treatment or return to the emergency department if she cannot get an appointment.  Patient advised to return to the ER for symptoms of change or worsen or for new concerns if she is unable to see primary care.  INITIAL IMPRESSION / ASSESSMENT AND PLAN / ED COURSE  Pertinent labs & imaging results that were available during my care of the patient were reviewed by me and considered in my medical decision making (see chart for details).  ____________________________________________   FINAL CLINICAL IMPRESSION(S) / ED DIAGNOSES  Final diagnoses:  None    Note:  This document was prepared using Dragon voice recognition software and may include unintentional dictation errors.   Chinita Pester, FNP 01/28/20 2244    Gilles Chiquito, MD 01/28/20  [phone]

## 2020-03-27 ENCOUNTER — Other Ambulatory Visit: Payer: Self-pay | Admitting: Family Medicine

## 2020-03-27 DIAGNOSIS — Z3201 Encounter for pregnancy test, result positive: Secondary | ICD-10-CM

## 2020-04-03 ENCOUNTER — Other Ambulatory Visit: Payer: Self-pay

## 2020-04-03 ENCOUNTER — Ambulatory Visit
Admission: RE | Admit: 2020-04-03 | Discharge: 2020-04-03 | Disposition: A | Discharge status: Home / Self Care | Payer: Medicaid Other | Source: Ambulatory Visit | Attending: Family Medicine | Admitting: Family Medicine

## 2020-04-03 DIAGNOSIS — Z3201 Encounter for pregnancy test, result positive: Secondary | ICD-10-CM | POA: Diagnosis present

## 2020-04-14 ENCOUNTER — Encounter: Payer: Medicaid Other | Admitting: Obstetrics and Gynecology

## 2020-05-14 ENCOUNTER — Other Ambulatory Visit: Payer: Self-pay

## 2020-05-14 ENCOUNTER — Emergency Department
Admission: EM | Admit: 2020-05-14 | Discharge: 2020-05-14 | Disposition: A | Discharge status: Home / Self Care | Payer: Medicaid Other | Attending: Emergency Medicine | Admitting: Emergency Medicine

## 2020-05-14 ENCOUNTER — Encounter: Payer: Self-pay | Admitting: Emergency Medicine

## 2020-05-14 DIAGNOSIS — Z87891 Personal history of nicotine dependence: Secondary | ICD-10-CM | POA: Diagnosis not present

## 2020-05-14 DIAGNOSIS — U071 COVID-19: Secondary | ICD-10-CM | POA: Diagnosis not present

## 2020-05-14 DIAGNOSIS — B349 Viral infection, unspecified: Secondary | ICD-10-CM | POA: Diagnosis not present

## 2020-05-14 DIAGNOSIS — R070 Pain in throat: Secondary | ICD-10-CM | POA: Diagnosis present

## 2020-05-14 DIAGNOSIS — J029 Acute pharyngitis, unspecified: Secondary | ICD-10-CM

## 2020-05-14 LAB — GROUP A STREP BY PCR: Group A Strep by PCR: NOT DETECTED

## 2020-05-14 MED ORDER — BENZONATATE 100 MG PO CAPS: ORAL_CAPSULE | ORAL | 0 refills | Status: DC

## 2020-05-14 MED ORDER — ACETAMINOPHEN 325 MG PO TABS
650.0000 mg | ORAL_TABLET | Freq: Once | ORAL | Status: AC
  Administered 2020-05-14: 650 mg via ORAL
  Filled 2020-05-14: qty 2

## 2020-05-14 MED ORDER — PREDNISONE 20 MG PO TABS: 40.0000 mg | ORAL_TABLET | Freq: Every day | ORAL | 0 refills | Status: AC

## 2020-05-14 NOTE — ED Provider Notes (Signed)
-----------------------------------------   5:08 PM on 05/14/2020 -----------------------------------------  Blood pressure 91/76, pulse (!) 106, temperature 100 F (37.8 C), temperature source Oral, resp. rate 20, height 5\' 4"  (1.626 m), weight 81.6 kg, last menstrual period 02/20/2020, SpO2 99 %, unknown if currently breastfeeding.  Assuming care from 02/22/2020, PA-C/NP-C.  In short, Ebony [name] is a 25 y.o. female with a chief complaint of Generalized Body Aches and Headache .  Refer to the original H&P for additional details.  The current plan of care is to await strep test result and treat appropriately.   ____________________________________________   LABS (pertinent positives/negatives) Labs Reviewed  GROUP A STREP BY PCR  SARS CORONAVIRUS 2 (TAT 6-24 HRS)  ____________________________________________  PROCEDURES  Tylenol 650 mg PO  Procedures ____________________________________________  INITIAL IMPRESSION / ASSESSMENT AND PLAN / ED COURSE  DDX: strep pharyngitis, COVID, viral URI  Patient was evaluated for complaint which included sore throat, headache, generalized body aches.  Patient's rapid strep test was negative at this time.  COVID PCR is pending at the time of this disposition.  Patient will be discharged with an assumed COVID diagnosis.  She is encouraged to quarantine for the next 5 days.  She will continue to hydrate and take Tylenol or Motrin as needed for symptom relief.  Refills provided as appropriate.  We will follow her COVID results [facility] MyChart.  Ebony [name] was evaluated in Emergency Department on 05/14/2020 for the symptoms described in the history of present illness. She was evaluated in the context of the global COVID-19 pandemic, which necessitated consideration that the patient might be at risk for infection with the SARS-CoV-2 virus that causes COVID-19. Institutional protocols and algorithms that pertain to the evaluation of patients at risk  for COVID-19 are in a state of rapid change based on information released by regulatory bodies including the CDC and federal and state organizations. These policies and algorithms were followed during the patient's care in the ED. ____________________________________________  FINAL CLINICAL IMPRESSION(S) / ED DIAGNOSES  Final diagnoses:  Viral illness  Acute pharyngitis, unspecified etiology  Clinical diagnosis of COVID-19      07/12/2020, PA-C 05/14/20 1710    07/12/20, MD 05/14/20 1733

## 2020-05-14 NOTE — Discharge Instructions (Signed)
You are being treated with the presumption that you have COVID.  She remain on house quarantine for the next 5 to 7 days.  Take over-the-counter Tylenol and Motrin help reduce fevers.  Take prescription medications as provided.  You may take additional cough and cold symptom relief medication as needed.  Continue to hydrate and rest as necessary.  Follow-up with your primary provider, local community clinic, or this ED for sharply worsening symptoms.

## 2020-05-14 NOTE — ED Provider Notes (Signed)
[facility] Emergency Department Provider Note   ____________________________________________   Event Date/Time   First MD Initiated Contact with Patient 05/14/20 1412     (approximate)  I have reviewed the triage vital signs and the nursing notes.   HISTORY  Chief Complaint Generalized Body Aches and Headache    HPI Ebony [name] is a 25 y.o. female patient presents with sore throat, headache, generalized body aches.  Patient stated onset of complaint was 3 days ago.  Patient denies recent travel or known exposure to COVID-19.  Patient that she completed her Covid vaccine in June 2021.  Patient not taken a booster.  Patient has not taken  flu shot for this season.  Patient rates pain as 10/10.  Prescribed pain is "achy".  Patient able to tolerate food and fluids with mild discomfort.         Past Medical History:  Diagnosis Date  . Acne   . Eczema, allergic   . History of tonsillectomy     Patient Active Problem List   Diagnosis Date Noted  . Decreased fetal movement 12/20/2017  . Blighted ovum 05/27/2017  . Obesity, pediatric, BMI 95th to 98th percentile for age 66/15/2016  . Intermenstrual bleeding 12/19/2014  . Allergic contact dermatitis 12/17/2014    Past Surgical History:  Procedure Laterality Date  . ADENOIDECTOMY    . DILATION AND EVACUATION N/A 05/29/2017   Procedure: DILATATION AND EVACUATION;  Surgeon: Conard Novak, MD;  Location: [facility] ORS;  Service: Gynecology;  Laterality: N/A;  . TONSILLECTOMY    . WISDOM TOOTH EXTRACTION      Prior to Admission medications   Medication Sig Start Date End Date Taking? Authorizing Provider  desonide (DESOWEN) 0.05 % cream Apply to affected area 2 times daily 01/28/20 01/27/21  Triplett, Rulon Eisenmenger B, FNP  ibuprofen (ADVIL) 600 MG tablet Take 1 tablet (600 mg total) by mouth every 8 (eight) hours as needed. 12/06/19   Lorre Munroe, NP  ondansetron (ZOFRAN ODT) 4 MG disintegrating tablet  Take 1 tablet (4 mg total) by mouth every 8 (eight) hours as needed for nausea or vomiting. 06/26/17   Tommi Rumps, PA-C  sodium chloride (OCEAN) 0.65 % SOLN nasal spray Place 1 spray into both nostrils as needed for congestion. 02/09/18   Enid Derry, PA-C    Allergies Patient has no known allergies.  Family History  Problem Relation Age of Onset  . Diabetes Brother     Social History Social History   Tobacco Use  . Smoking status: Former Smoker    Years: 1.00    Types: Cigarettes    Quit date: 03/28/2017    Years since quitting: 3.1  . Smokeless tobacco: Never Used  . Tobacco comment: 1-2 CIG /WEEK  Vaping Use  . Vaping Use: Never used  Substance Use Topics  . Alcohol use: Yes    Alcohol/week: 0.0 standard drinks    Comment: occ  . Drug use: Not Currently    Frequency: 2.0 times per week    Review of Systems Constitutional: No fever/chills.  Body aches. Eyes: No visual changes. ENT: Sore throat.   Cardiovascular: Denies chest pain. Respiratory: Denies shortness of breath. Gastrointestinal: No abdominal pain.  No nausea, no vomiting.  No diarrhea.  No constipation. Genitourinary: Negative for dysuria. Musculoskeletal: Negative for back pain. Skin: Negative for rash. Neurological: Positive for headaches, but denies focal weakness or numbness.   ____________________________________________   PHYSICAL EXAM:  VITAL SIGNS: ED Triage  Vitals [05/14/20 1218]  Enc Vitals Group     BP 91/76     Pulse Rate (!) 106     Resp 20     Temp 100 F (37.8 C)     Temp Source Oral     SpO2 99 %     Weight 180 lb (81.6 kg)     Height 5\' 4"  (1.626 m)     Head Circumference      Peak Flow      Pain Score 10     Pain Loc      Pain Edu?      Excl. in GC?     Constitutional: Febrile.  Alert and oriented. Well appearing and in no acute distress. Eyes: Conjunctivae are normal. PERRL. EOMI. Head: Atraumatic. Nose: No congestion/rhinnorhea. Mouth/Throat: Mucous  membranes are moist.  Oropharynx erythematous.  Status post tonsillectomy.   Neck: No stridor.  Hematological/Lymphatic/Immunilogical: No cervical lymphadenopathy. Cardiovascular: Tachycardic, regular rhythm. Grossly normal heart sounds.  Good peripheral circulation. Respiratory: Normal respiratory effort.  No retractions. Lungs CTAB. Genitourinary: Deferred Musculoskeletal: No lower extremity tenderness nor edema.  No joint effusions. Neurologic:  Normal speech and language. No gross focal neurologic deficits are appreciated. No gait instability. Skin:  Skin is warm, dry and intact. No rash noted. Psychiatric: Mood and affect are normal. Speech and behavior are normal.  ____________________________________________   LABS (all labs ordered are listed, but only abnormal results are displayed)  Labs Reviewed  GROUP A STREP BY PCR  SARS CORONAVIRUS 2 (TAT 6-24 HRS)   ____________________________________________  EKG   ____________________________________________  RADIOLOGY , personally viewed and evaluated these images (plain radiographs) as part of my medical decision making, as well as reviewing the written report by the radiologist.  ED MD interpretation:    Official radiology report(s): No results found.  ____________________________________________   PROCEDURES  Procedure(s) performed (including Critical Care):  Procedures   ____________________________________________   INITIAL IMPRESSION / ASSESSMENT AND PLAN / ED COURSE  As part of my medical decision making, I reviewed the following data within the electronic MEDICAL RECORD NUMBER       Due to shift change patient care transferred to [facility].  Awaiting results of rapid strep test.  Patient advised COVID-19 test results will be available later today in the MyChart app.         ____________________________________________   FINAL CLINICAL IMPRESSION(S) / ED DIAGNOSES  Final  diagnoses:  Viral illness  Acute pharyngitis, unspecified etiology     ED Discharge Orders    None      *Please note:  [name] was evaluated in Emergency Department on 05/14/2020 for the symptoms described in the history of present illness. She was evaluated in the context of the global COVID-19 pandemic, which necessitated consideration that the patient might be at risk for infection with the SARS-CoV-2 virus that causes COVID-19. Institutional protocols and algorithms that pertain to the evaluation of patients at risk for COVID-19 are in a state of rapid change based on information released by regulatory bodies including the CDC and federal and state organizations. These policies and algorithms were followed during the patient's care in the ED.  Some ED evaluations and interventions may be delayed as a result of limited staffing during and the pandemic.*   Note:  This document was prepared using Dragon voice recognition software and may include unintentional dictation errors.    07/12/2020, PA-C 05/14/20 1553    07/12/20,  Sheria Lang, MD 05/15/20 [phone]

## 2020-05-14 NOTE — ED Notes (Signed)
Strep swab collected by PA-C.

## 2020-05-14 NOTE — ED Triage Notes (Signed)
Pt to ED via POV with c/o sore throat, HA, and generalized body aches. Pt A&O x4, NAD noted at this time. Pt states symptoms x 3 days, denies known exposure.

## 2020-05-15 LAB — SARS CORONAVIRUS 2 (TAT 6-24 HRS): SARS Coronavirus 2: POSITIVE — AB

## 2020-06-05 ENCOUNTER — Emergency Department: Payer: Medicaid Other

## 2020-06-05 ENCOUNTER — Emergency Department
Admission: EM | Admit: 2020-06-05 | Discharge: 2020-06-05 | Disposition: A | Discharge status: Home / Self Care | Payer: Medicaid Other | Attending: Emergency Medicine | Admitting: Emergency Medicine

## 2020-06-05 ENCOUNTER — Other Ambulatory Visit: Payer: Self-pay

## 2020-06-05 DIAGNOSIS — J1282 Pneumonia due to coronavirus disease 2019: Secondary | ICD-10-CM | POA: Diagnosis not present

## 2020-06-05 DIAGNOSIS — U071 COVID-19: Secondary | ICD-10-CM

## 2020-06-05 DIAGNOSIS — Z87891 Personal history of nicotine dependence: Secondary | ICD-10-CM | POA: Insufficient documentation

## 2020-06-05 DIAGNOSIS — R519 Headache, unspecified: Secondary | ICD-10-CM | POA: Diagnosis present

## 2020-06-05 NOTE — Discharge Instructions (Signed)
Follow-up with your primary care provider if any continued problems or concerns.  Continue to take Tylenol or ibuprofen as needed for body aches, fever or headache.  Take deep breaths and walk frequently.  Return to the emergency department if any severe shortness of breath or difficulty breathing.

## 2020-06-05 NOTE — ED Provider Notes (Signed)
[facility] Emergency Department Provider Note  ____________________________________________   Event Date/Time   First MD Initiated Contact with Patient 06/05/20 1158     (approximate)  I have reviewed the triage vital signs and the nursing notes.   HISTORY  Chief Complaint Cough and Headache   HPI Ebony [name] is a 25 y.o. female presents to the ED with complaint of headache and cough for the last 3 days.  Patient was diagnosed with COVID on 05/14/2020.  She denies any nausea, vomiting, diarrhea, fever or chills.  Patient is a former smoker.  Rates her pain as 5 out of 10.      Past Medical History:  Diagnosis Date  . Acne   . Eczema, allergic   . History of tonsillectomy     Patient Active Problem List   Diagnosis Date Noted  . Decreased fetal movement 12/20/2017  . Blighted ovum 05/27/2017  . Obesity, pediatric, BMI 95th to 98th percentile for age 41/15/2016  . Intermenstrual bleeding 12/19/2014  . Allergic contact dermatitis 12/17/2014    Past Surgical History:  Procedure Laterality Date  . ADENOIDECTOMY    . DILATION AND EVACUATION N/A 05/29/2017   Procedure: DILATATION AND EVACUATION;  Surgeon: Conard Novak, MD;  Location: [facility] ORS;  Service: Gynecology;  Laterality: N/A;  . TONSILLECTOMY    . WISDOM TOOTH EXTRACTION      Prior to Admission medications   Medication Sig Start Date End Date Taking? Authorizing Provider  sodium chloride (OCEAN) 0.65 % SOLN nasal spray Place 1 spray into both nostrils as needed for congestion. 02/09/18 06/05/20  Enid Derry, PA-C    Allergies Patient has no known allergies.  Family History  Problem Relation Age of Onset  . Diabetes Brother     Social History Social History   Tobacco Use  . Smoking status: Former Smoker    Years: 1.00    Types: Cigarettes    Quit date: 03/28/2017    Years since quitting: 3.1  . Smokeless tobacco: Never Used  . Tobacco comment: 1-2 CIG /WEEK   Vaping Use  . Vaping Use: Never used  Substance Use Topics  . Alcohol use: Yes    Alcohol/week: 0.0 standard drinks    Comment: occ  . Drug use: Not Currently    Frequency: 2.0 times per week    Review of Systems Constitutional: No fever/chills Eyes: No visual changes. ENT: No sore throat. Cardiovascular: Denies chest pain. Respiratory: Denies shortness of breath.  Positive for cough. Gastrointestinal: No abdominal pain.  No nausea, no vomiting.  No diarrhea.   Genitourinary: Negative for dysuria. Musculoskeletal: Negative for back pain. Skin: Negative for rash. Neurological: Positive for headache, negative for focal weakness or numbness. ____________________________________________   PHYSICAL EXAM:  VITAL SIGNS: ED Triage Vitals  Enc Vitals Group     BP 06/05/20 1115 113/71     Pulse Rate 06/05/20 1115 65     Resp 06/05/20 1115 18     Temp 06/05/20 1115 98.6 F (37 C)     Temp Source 06/05/20 1115 Oral     SpO2 06/05/20 1115 100 %     Weight 06/05/20 1116 180 lb (81.6 kg)     Height 06/05/20 1116 5\' 4"  (1.626 m)     Head Circumference --      Peak Flow --      Pain Score 06/05/20 1116 5     Pain Loc --      Pain Edu? --  Excl. in GC? --     Constitutional: Alert and oriented. Well appearing and in no acute distress.  Patient is able to talk in complete sentences without any difficulty. Eyes: Conjunctivae are normal.  Head: Atraumatic. Nose: No congestion/rhinnorhea. Mouth/Throat: Mucous membranes are moist.  Oropharynx non-erythematous. Neck: No stridor.   Cardiovascular: Normal rate, regular rhythm. Grossly normal heart sounds.  Good peripheral circulation. Respiratory: Normal respiratory effort.  No retractions. Lungs CTAB. Gastrointestinal: Soft and nontender. No distention.  Musculoskeletal: Moves upper and lower extremities no difficulty.  Patient is able to ambulate without any assistance. Neurologic:  Normal speech and language. No gross focal  neurologic deficits are appreciated.  Skin:  Skin is warm, dry and intact. No rash noted. Psychiatric: Mood and affect are normal. Speech and behavior are normal.  ____________________________________________   LABS (all labs ordered are listed, but only abnormal results are displayed)  Labs Reviewed - No data to display ____________________________________________  RADIOLOGY I, Tommi Rumps, personally viewed and evaluated these images (plain radiographs) as part of my medical decision making, as well as reviewing the written report by the radiologist.   Official radiology report(s): DG Chest Port 1 View  Result Date: 06/05/2020 CLINICAL DATA:  Cough for 3 days. COVID positive 05/14/2020. Progressive cough. EXAM: PORTABLE CHEST 1 VIEW COMPARISON:  Radiograph 07/21/2013 FINDINGS: Lung volumes are low. Vague bilateral lung base opacities. Heart is normal in size for technique. Normal mediastinal contours. No pneumothorax or pneumomediastinum. No pleural fluid. No acute osseous abnormalities are seen. IMPRESSION: Low lung volumes with vague bilateral lung base opacities suspicious for pneumonia, pattern typical of COVID-19. Electronically Signed   By: Narda Rutherford M.D.   On: 06/05/2020 14:56    ____________________________________________   PROCEDURES  Procedure(s) performed (including Critical Care):  Procedures   ____________________________________________   INITIAL IMPRESSION / ASSESSMENT AND PLAN / ED COURSE  As part of my medical decision making, I reviewed the following data within the electronic MEDICAL RECORD NUMBER Notes from prior ED visits and [place] Controlled Substance Database  25 year old female presents to the ED with complaint of headache and cough for the last 3 days.  Patient was diagnosed with COVID on 05/24/2020.  She denies any respiratory symptoms, change in taste or smell, nausea, vomiting, diarrhea.  Patient is stable and afebrile in the ED.  O2 sat was  100%.  Patient was able to talk in complete sentences without any difficulty and ambulate without any assistance.  Patient was made aware that her chest x-ray did show that she had pneumonia due to Covid virus.  Patient is encouraged to continue drinking fluids and staying active.  She is to take Tylenol or ibuprofen as needed for headache.  She is to follow-up with her PCP if any continued problems.  She is also encouraged to return to the emergency department if any severe worsening of her symptoms or difficulty breathing.  ____________________________________________   FINAL CLINICAL IMPRESSION(S) / ED DIAGNOSES  Final diagnoses:  Pneumonia due to COVID-19 virus     ED Discharge Orders    None      *Please note:  KIMBLE DELAURENTIS was evaluated in Emergency Department on 06/05/2020 for the symptoms described in the history of present illness. She was evaluated in the context of the global COVID-19 pandemic, which necessitated consideration that the patient might be at risk for infection with the SARS-CoV-2 virus that causes COVID-19. Institutional protocols and algorithms that pertain to the evaluation of patients at risk for COVID-19  are in a state of rapid change based on information released by regulatory bodies including the CDC and federal and state organizations. These policies and algorithms were followed during the patient's care in the ED.  Some ED evaluations and interventions may be delayed as a result of limited staffing during and the pandemic.*   Note:  This document was prepared using Dragon voice recognition software and may include unintentional dictation errors.    Tommi Rumps, PA-C 06/05/20 1659    Jene Every, MD 06/06/20 [phone]

## 2020-06-05 NOTE — ED Triage Notes (Signed)
Pt to ED via POV c/o headache and cough x 3 days. Pt alert and oriented X4, cooperative, RR even and unlabored, color WNL. Pt in NAD.

## 2020-07-02 ENCOUNTER — Encounter (HOSPITAL_COMMUNITY): Payer: Self-pay | Admitting: Emergency Medicine

## 2020-07-02 ENCOUNTER — Emergency Department (HOSPITAL_COMMUNITY)
Admission: EM | Admit: 2020-07-02 | Discharge: 2020-07-02 | Disposition: A | Discharge status: Home / Self Care | Payer: Medicaid Other | Attending: Emergency Medicine | Admitting: Emergency Medicine

## 2020-07-02 DIAGNOSIS — Z87891 Personal history of nicotine dependence: Secondary | ICD-10-CM | POA: Insufficient documentation

## 2020-07-02 DIAGNOSIS — R059 Cough, unspecified: Secondary | ICD-10-CM

## 2020-07-02 DIAGNOSIS — R519 Headache, unspecified: Secondary | ICD-10-CM | POA: Diagnosis not present

## 2020-07-02 DIAGNOSIS — G8929 Other chronic pain: Secondary | ICD-10-CM | POA: Insufficient documentation

## 2020-07-02 MED ORDER — BENZONATATE 100 MG PO CAPS: 100.0000 mg | ORAL_CAPSULE | Freq: Three times a day (TID) | ORAL | 0 refills | Status: DC | PRN

## 2020-07-02 NOTE — ED Provider Notes (Signed)
[name] EM[facility] Provider Note   CSN: [id number] Arrival date & time: 07/02/20  2009     History Chief Complaint  Patient presents with   Cough    Ebony [name] is a 25 y.o. female with a history of eczema, tonsillectomy. Reports that she tested positive for COVID-19 in early January.  And then developed pneumonia at the end of January.  Per chart review patient was seen at [facility] on 05/14/2020 where she tested positive for COVID-19.  Patient reports that since testing positive for COVID-19 she has had a persistent dry cough.  Patient reports that there has been no worsening of her cough.  Patient reports no improvement with over-the-counter cough suppressants.  Patient denies any hemoptysis, shortness of breath, chest pain, fevers, chills, nasal congestion, rhinorrhea, sore throat.  Patient also reports that she has had intermittent headaches since testing positive for COVID-19.  Patient reports that when experiences a headache the onset is gradual and pain gradually worsens.  Patient denies any change in pain with exertion.  At present patient reports headache.  Pain is 3/10 on the pain scale.  She states that her pain does not get worse than this.  Patient denies any focal neurological deficits, slurred speech, visual disturbance, neck or back pain.  Denies any recent falls or traumatic injuries.   HPI     Past Medical History:  Diagnosis Date   Acne    Eczema, allergic    History of tonsillectomy     Patient Active Problem List   Diagnosis Date Noted   Decreased fetal movement 12/20/2017   Blighted ovum 05/27/2017   Obesity, pediatric, BMI 95th to 98th percentile for age 71/15/2016   Intermenstrual bleeding 12/19/2014   Allergic contact dermatitis 12/17/2014    Past Surgical History:  Procedure Laterality Date   ADENOIDECTOMY     DILATION AND EVACUATION N/A 05/29/2017   Procedure: DILATATION AND  EVACUATION;  Surgeon: Ebony Novak, MD;  Location: [facility] ORS;  Service: Gynecology;  Laterality: N/A;   TONSILLECTOMY     WISDOM TOOTH EXTRACTION       OB History    Gravida  4   Para  1   Term  1   Preterm      AB      Living  1     SAB      IAB      Ectopic      Multiple  0   Live Births  1           Family History  Problem Relation Age of Onset   Diabetes Brother     Social History   Tobacco Use   Smoking status: Former Smoker    Years: 1.00    Types: Cigarettes    Quit date: 03/28/2017    Years since quitting: 3.2   Smokeless tobacco: Never Used   Tobacco comment: 1-2 CIG /WEEK  Vaping Use   Vaping Use: Never used  Substance Use Topics   Alcohol use: Yes    Alcohol/week: 0.0 standard drinks    Comment: occ   Drug use: Not Currently    Frequency: 2.0 times per week    Home Medications Prior to Admission medications   Medication Sig Start Date End Date Taking? Authorizing Provider  sodium chloride (OCEAN) 0.65 % SOLN nasal spray Place 1 spray into both nostrils as needed for congestion. 02/09/18 06/05/20  Enid Derry, PA-C  Allergies    Patient has no known allergies.  Review of Systems   Review of Systems  Constitutional: Negative for chills and fever.  Eyes: Negative for visual disturbance.  Respiratory: Positive for cough. Negative for shortness of breath.   Cardiovascular: Negative for chest pain.  Gastrointestinal: Negative for abdominal pain, nausea and vomiting.  Genitourinary: Negative for difficulty urinating and dysuria.  Musculoskeletal: Negative for back pain and neck pain.  Skin: Negative for color change and rash.  Neurological: Positive for headaches. Negative for dizziness, tremors, seizures, syncope, facial asymmetry, speech difficulty, weakness, light-headedness and numbness.  Psychiatric/Behavioral: Negative for confusion.    Physical Exam Updated Vital Signs BP 115/71 (BP Location: Right Arm)     Pulse 79    Temp 98.2 F (36.8 C) (Oral)    Resp 14    LMP 06/11/2020    SpO2 98%   Physical Exam Vitals and nursing note reviewed.  Constitutional:      General: She is not in acute distress.    Appearance: She is not ill-appearing, toxic-appearing or diaphoretic.  HENT:     Head: Normocephalic and atraumatic. No abrasion, contusion, masses, right periorbital erythema, left periorbital erythema or laceration.     Jaw: No trismus or pain on movement.     Mouth/Throat:     Mouth: Mucous membranes are moist.     Pharynx: Oropharynx is clear. Uvula midline. No pharyngeal swelling, oropharyngeal exudate, posterior oropharyngeal erythema or uvula swelling.  Eyes:     General: No scleral icterus.       Right eye: No discharge.        Left eye: No discharge.     Extraocular Movements: Extraocular movements intact.     Pupils: Pupils are equal, round, and reactive to light.  Cardiovascular:     Rate and Rhythm: Normal rate.  Pulmonary:     Effort: Pulmonary effort is normal. No tachypnea, bradypnea or respiratory distress.     Breath sounds: No stridor. No decreased breath sounds, wheezing, rhonchi or rales.  Musculoskeletal:     Cervical back: Normal range of motion and neck supple. No rigidity.     Right lower leg: No swelling, deformity, lacerations, tenderness or bony tenderness. No edema.     Left lower leg: No swelling, deformity, lacerations, tenderness or bony tenderness. No edema.  Skin:    General: Skin is warm and dry.  Neurological:     General: No focal deficit present.     Mental Status: She is alert and oriented to person, place, and time.     GCS: GCS eye subscore is 4. GCS verbal subscore is 5. GCS motor subscore is 6.     Cranial Nerves: No cranial nerve deficit or facial asymmetry.     Sensory: Sensation is intact.     Motor: No weakness, tremor, seizure activity or pronator drift.     Coordination: Romberg sign negative. Finger-Nose-Finger Test normal.     Gait:  Gait is intact. Gait normal.     Comments: CN II-XII intact, equal grip strength, +5 strength to bilateral upper and lower extremities    Psychiatric:        Behavior: Behavior is cooperative.     ED Results / Procedures / Treatments   Labs (all labs ordered are listed, but only abnormal results are displayed) Labs Reviewed - No data to display  EKG None  Radiology No results found.  Procedures Procedures   Medications Ordered in ED Medications - No data  to display  ED Course  I have reviewed the triage vital signs and the nursing notes.  Pertinent labs & imaging results that were available during my care of the patient were reviewed by me and considered in my medical decision making (see chart for details).    MDM Rules/Calculators/A&P                          Alert 25 year old female no acute distress, nontoxic-appearing.  Patient presents with chief complaint of persistent nonproductive cough and intermittent headaches since testing positive for COVID-19 on 05/14/2020.    Able to speak in full complete sentences without difficulty.  Oxygen saturation 98% on room air.  Low suspicion for subarachnoid hemorrhage as headache is gradual in onset, pain not maximal at onset, no worsening of pain with exertion, no focal neurological deficits noted on physical exam.    Low suspicion for pneumonia as patient has no fevers, chills, shortness of breath, or adventitious lung sounds on physical exam.  Low suspicion for pulmonary embolism as patient has no shortness of breath, pleuritic chest pain, hemoptysis, unilateral leg swelling or tenderness, previous DVT or PE, tachycardia, or recent surgery.  Please that patient symptoms may be sequelae from her COVID-19 infection.  Will prescribe Tessalon to help with her cough.  Advised patient to use ibuprofen and Tylenol as needed for her headaches.  We will have patient follow-up with primary care for further evaluation. Discussed results,  findings, treatment and follow up. Patient advised of return precautions. Patient verbalized understanding and agreed with plan.  Ebony [name] was evaluated in Emergency Department on 07/03/2020 for the symptoms described in the history of present illness. She was evaluated in the context of the global COVID-19 pandemic, which necessitated consideration that the patient might be at risk for infection with the SARS-CoV-2 virus that causes COVID-19. Institutional protocols and algorithms that pertain to the evaluation of patients at risk for COVID-19 are in a state of rapid change based on information released by regulatory bodies including the CDC and federal and state organizations. These policies and algorithms were followed during the patient's care in the ED.   Final Clinical Impression(s) / ED Diagnoses Final diagnoses:  Cough  Chronic nonintractable headache, unspecified headache type    Rx / DC Orders ED Discharge Orders         Ordered    benzonatate (TESSALON) 100 MG capsule  Every 8 hours PRN        07/02/20 2147           Berneice Heinrich 07/03/20 Lazarus Gowda    Eber Hong, MD 07/04/20 1040

## 2020-07-02 NOTE — ED Triage Notes (Signed)
Pt reports having covid in January of this year, c/o cough and headache.

## 2020-07-02 NOTE — Discharge Instructions (Addendum)
You came to the emergency department today to be evaluated for your cough and headache.  Physical exam was reassuring.  Your cough and intermittent headaches may be due to your previous COVID-19 infection.  No signs of new infection to be worried about at this time.  I have given you a prescription for Tessalon to help with your cough.  Please take 1 pill as needed every 8 hours for cough.   For your headache please take Ibuprofen (Advil, motrin) and Tylenol (acetaminophen) to relieve your pain.    You may take up to 600 MG (3 pills) of normal strength ibuprofen every 8 hours as needed.   You make take tylenol, up to 1,000 mg (two extra strength pills) every 8 hours as needed.   It is safe to take ibuprofen and tylenol at the same time as they work differently.   Do not take more than 3,000 mg tylenol in a 24 hour period (not more than one dose every 8 hours.  Please check all medication labels as many medications such as pain and cold medications may contain tylenol.  Do not drink alcohol while taking these medications.  Do not take other NSAID'S while taking ibuprofen (such as aleve or naproxen).  Please take ibuprofen with food to decrease stomach upset.  Get help right away if: You cough up blood. You have difficulty breathing. Your heartbeat is very fast.Your headache becomes severe quickly. Your headache gets worse after moderate to intense physical activity. You have repeated vomiting. You have a stiff neck. You have a loss of vision. You have problems with speech. You have pain in the eye or ear. You have muscular weakness or loss of muscle control. You lose your balance or have trouble walking. You feel faint or pass out. You have confusion. You have a seizure.

## 2020-09-04 ENCOUNTER — Encounter: Payer: Medicaid Other | Admitting: Obstetrics

## 2020-09-20 ENCOUNTER — Other Ambulatory Visit: Payer: Self-pay

## 2020-09-20 ENCOUNTER — Emergency Department
Admission: EM | Admit: 2020-09-20 | Discharge: 2020-09-20 | Disposition: A | Discharge status: Home / Self Care | Payer: Medicaid Other | Attending: Emergency Medicine | Admitting: Emergency Medicine

## 2020-09-20 DIAGNOSIS — R300 Dysuria: Secondary | ICD-10-CM | POA: Diagnosis present

## 2020-09-20 DIAGNOSIS — Z87891 Personal history of nicotine dependence: Secondary | ICD-10-CM | POA: Insufficient documentation

## 2020-09-20 LAB — URINALYSIS, COMPLETE (UACMP) WITH MICROSCOPIC
Bilirubin Urine: NEGATIVE
Glucose, UA: NEGATIVE mg/dL
Hgb urine dipstick: NEGATIVE
Ketones, ur: NEGATIVE mg/dL
Leukocytes,Ua: NEGATIVE
Nitrite: NEGATIVE
Protein, ur: NEGATIVE mg/dL
Specific Gravity, Urine: 1.023 (ref 1.005–1.030)
pH: 5 (ref 5.0–8.0)

## 2020-09-20 LAB — POC URINE PREG, ED: Preg Test, Ur: NEGATIVE

## 2020-09-20 MED ORDER — PHENAZOPYRIDINE HCL 200 MG PO TABS: 200.0000 mg | ORAL_TABLET | Freq: Three times a day (TID) | ORAL | 0 refills | Status: DC | PRN

## 2020-09-20 NOTE — ED Triage Notes (Signed)
Pt comes with c/o UTI. Pt states recently dx and was prescribed meds. Pt unsure if it is back  And just didn't clear all the way up.

## 2020-09-20 NOTE — ED Notes (Signed)
Provided DC instructions. vErbalized understanding.

## 2020-09-20 NOTE — Discharge Instructions (Signed)
Urinalysis today shows no sign of bacterial infection.  Read and follow discharge care instruction.  Take Pyridium as directed.  This medicine will change the color of your urine.

## 2020-09-20 NOTE — ED Provider Notes (Signed)
[facility] Emergency Department Provider Note   ____________________________________________   Event Date/Time   First MD Initiated Contact with Patient 09/20/20 1037     (approximate)  I have reviewed the triage vital signs and the nursing notes.   HISTORY  Chief Complaint UTI    HPI Ebony [name] is a 25 y.o. female patient was treated for urinary tract infection at urgent care clinic in March 2022.  Patient states she believe her complaint has not completely resolved.  Patient state i intimating dysuria.  Patient denies vaginal discharge, flank pain, fever.  Patient pain is a 4/10.  No palliative measure for complaint.         Past Medical History:  Diagnosis Date  . Acne   . Eczema, allergic   . History of tonsillectomy     Patient Active Problem List   Diagnosis Date Noted  . Decreased fetal movement 12/20/2017  . Blighted ovum 05/27/2017  . Obesity, pediatric, BMI 95th to 98th percentile for age 07/19/2014  . Intermenstrual bleeding 12/19/2014  . Allergic contact dermatitis 12/17/2014    Past Surgical History:  Procedure Laterality Date  . ADENOIDECTOMY    . DILATION AND EVACUATION N/A 05/29/2017   Procedure: DILATATION AND EVACUATION;  Surgeon: Conard Novak, MD;  Location: [facility] ORS;  Service: Gynecology;  Laterality: N/A;  . TONSILLECTOMY    . WISDOM TOOTH EXTRACTION      Prior to Admission medications   Medication Sig Start Date End Date Taking? Authorizing Provider  phenazopyridine (PYRIDIUM) 200 MG tablet Take 1 tablet (200 mg total) by mouth 3 (three) times daily as needed for pain. 09/20/20  Yes Joni Reining, PA-C  benzonatate (TESSALON) 100 MG capsule Take 1 capsule (100 mg total) by mouth every 8 (eight) hours as needed for cough. 07/02/20   Haskel Schroeder, PA-C  sodium chloride (OCEAN) 0.65 % SOLN nasal spray Place 1 spray into both nostrils as needed for congestion. 02/09/18 06/05/20  Enid Derry, PA-C     Allergies Patient has no known allergies.  Family History  Problem Relation Age of Onset  . Diabetes Brother     Social History Social History   Tobacco Use  . Smoking status: Former Smoker    Years: 1.00    Types: Cigarettes    Quit date: 03/28/2017    Years since quitting: 3.4  . Smokeless tobacco: Never Used  . Tobacco comment: 1-2 CIG /WEEK  Vaping Use  . Vaping Use: Never used  Substance Use Topics  . Alcohol use: Yes    Alcohol/week: 0.0 standard drinks    Comment: occ  . Drug use: Not Currently    Frequency: 2.0 times per week    Review of Systems  Constitutional: No fever/chills Eyes: No visual changes. ENT: No sore throat. Cardiovascular: Denies chest pain. Respiratory: Denies shortness of breath. Gastrointestinal: No abdominal pain.  No nausea, no vomiting.  No diarrhea.  No constipation. Genitourinary:  positive for dysuria. Musculoskeletal: Negative for back pain. Skin: Negative for rash. Neurological: Negative for headaches, focal weakness or numbness.   ____________________________________________   PHYSICAL EXAM:  VITAL SIGNS: ED Triage Vitals  Enc Vitals Group     BP 09/20/20 0953 117/80     Pulse Rate 09/20/20 0953 84     Resp 09/20/20 0953 18     Temp 09/20/20 0953 98.5 F (36.9 C)     Temp Source 09/20/20 0953 Oral     SpO2 09/20/20 0953 99 %  Weight --      Height --      Head Circumference --      Peak Flow --      Pain Score 09/20/20 0954 4     Pain Loc --      Pain Edu? --      Excl. in GC? --     Constitutional: Alert and oriented. Well appearing and in no acute distress. Cardiovascular: Normal rate, regular rhythm. Grossly normal heart sounds.  Good peripheral circulation. Respiratory: Normal respiratory effort.  No retractions. Lungs CTAB. Gastrointestinal: Soft and nontender. No distention. No abdominal bruits. No CVA tenderness. Genitourinary: Deferred Musculoskeletal: No lower extremity tenderness nor  edema.  No joint effusions. Neurologic:  Normal speech and language. No gross focal neurologic deficits are appreciated. No gait instability. Skin:  Skin is warm, dry and intact. No rash noted. Psychiatric: Mood and affect are normal. Speech and behavior are normal.  ____________________________________________   LABS (all labs ordered are listed, but only abnormal results are displayed)  Labs Reviewed  URINALYSIS, COMPLETE (UACMP) WITH MICROSCOPIC - Abnormal; Notable for the following components:      Result Value   Color, Urine YELLOW (*)    APPearance HAZY (*)    Bacteria, UA RARE (*)    All other components within normal limits  POC URINE PREG, ED   ____________________________________________  EKG   ____________________________________________  RADIOLOGY I, Joni Reining, personally viewed and evaluated these images (plain radiographs) as part of my medical decision making, as well as reviewing the written report by the radiologist.  ED MD interpretation:    Official radiology report(s): No results found.  ____________________________________________   PROCEDURES  Procedure(s) performed (including Critical Care):  Procedures   ____________________________________________   INITIAL IMPRESSION / ASSESSMENT AND PLAN / ED COURSE  As part of my medical decision making, I reviewed the following data within the electronic MEDICAL RECORD NUMBER         Patient complains of dysuria and will was concerned that a urinary tract infection is not cleared from treatment in March 2022.  Discussed no acute findings indication of urinary tract infection on urinalysis today.  Patient given discharge care instruction prescription for Pyridium.  Patient advised [facility].      ____________________________________________   FINAL CLINICAL IMPRESSION(S) / ED DIAGNOSES  Final diagnoses:  Dysuria     ED Discharge Orders         Ordered     phenazopyridine (PYRIDIUM) 200 MG tablet  3 times daily PRN        09/20/20 1135          *Please note:  Ebony [name] was evaluated in Emergency Department on 09/20/2020 for the symptoms described in the history of present illness. She was evaluated in the context of the global COVID-19 pandemic, which necessitated consideration that the patient might be at risk for infection with the SARS-CoV-2 virus that causes COVID-19. Institutional protocols and algorithms that pertain to the evaluation of patients at risk for COVID-19 are in a state of rapid change based on information released by regulatory bodies including the CDC and federal and state organizations. These policies and algorithms were followed during the patient's care in the ED.  Some ED evaluations and interventions may be delayed as a result of limited staffing during and the pandemic.*   Note:  This document was prepared using Dragon voice recognition software and may include unintentional dictation errors.  Joni Reining, PA-C 09/20/20 1137    Dionne Bucy, MD 09/20/20 1323

## 2020-09-27 ENCOUNTER — Other Ambulatory Visit: Payer: Self-pay

## 2020-09-27 ENCOUNTER — Encounter: Payer: Medicaid Other | Admitting: Obstetrics

## 2020-10-05 ENCOUNTER — Ambulatory Visit: Payer: Medicaid Other | Admitting: Family Medicine

## 2020-10-05 ENCOUNTER — Other Ambulatory Visit: Payer: Self-pay

## 2020-10-05 ENCOUNTER — Encounter: Payer: Self-pay | Admitting: Family Medicine

## 2020-10-05 DIAGNOSIS — Z113 Encounter for screening for infections with a predominantly sexual mode of transmission: Secondary | ICD-10-CM | POA: Diagnosis not present

## 2020-10-05 LAB — WET PREP FOR TRICH, YEAST, CLUE
Trichomonas Exam: NEGATIVE
Yeast Exam: NEGATIVE

## 2020-10-05 NOTE — Progress Notes (Signed)
[facility] Department STI clinic/screening visit  Subjective:  Ebony [name] is a 25 y.o. female being seen today for an STI screening visit. The patient reports they do have symptoms.  Patient reports that they do not desire a pregnancy in the next year.   They reported they are not interested in discussing contraception today.  Patient's last menstrual period was 09/15/2020.   Patient has the following medical conditions:   Patient Active Problem List   Diagnosis Date Noted  . Decreased fetal movement 12/20/2017  . Blighted ovum 05/27/2017  . Obesity, pediatric, BMI 95th to 98th percentile for age 59/15/2016  . Intermenstrual bleeding 12/19/2014  . Allergic contact dermatitis 12/17/2014    Chief Complaint  Patient presents with  . SEXUALLY TRANSMITTED DISEASE    screening    HPI  Patient reports here for STI screening   Last HIV test per patient/review of record was 2019 Patient reports last pap was 09/23/2017  See flowsheet for further details and programmatic requirements.    The following portions of the patient's history were reviewed and updated as appropriate: allergies, current medications, past medical history, past social history, past surgical history and problem list.  Objective:  There were no vitals filed for this visit.  Physical Exam Vitals and nursing note reviewed.  Constitutional:      Appearance: Normal appearance.  HENT:     Head: Normocephalic and atraumatic.     Mouth/Throat:     Mouth: Mucous membranes are moist.     Pharynx: Oropharynx is clear. No oropharyngeal exudate or posterior oropharyngeal erythema.  Pulmonary:     Effort: Pulmonary effort is normal.  Chest:  Breasts:     Right: No axillary adenopathy or supraclavicular adenopathy.     Left: No axillary adenopathy or supraclavicular adenopathy.    Abdominal:     General: Abdomen is flat.     Palpations: There is no mass.     Tenderness: There is no abdominal  tenderness. There is no rebound.  Genitourinary:    General: Normal vulva.     Exam position: Lithotomy position.     Pubic Area: No rash or pubic lice.      Labia:        Right: No rash or lesion.        Left: No rash or lesion.      Vagina: Normal. No vaginal discharge, erythema, bleeding or lesions.     Cervix: No cervical motion tenderness, discharge, friability, lesion or erythema.     Uterus: Normal.      Adnexa: Right adnexa normal and left adnexa normal.     Rectum: Normal.     Comments: External genitalia without, lice, nits, erythema, edema , lesions or inguinal adenopathy. Vagina with normal mucosa and thick white discharge and pH > 4.  Cervix without visual lesions, uterus firm, mobile, non-tender, no masses, CMT adnexal fullness or tenderness.  Lymphadenopathy:     Head:     Right side of head: No preauricular or posterior auricular adenopathy.     Left side of head: No preauricular or posterior auricular adenopathy.     Cervical: No cervical adenopathy.     Upper Body:     Right upper body: No supraclavicular or axillary adenopathy.     Left upper body: No supraclavicular or axillary adenopathy.     Lower Body: No right inguinal adenopathy. No left inguinal adenopathy.  Skin:    General: Skin is warm and dry.  Findings: No rash.  Neurological:     Mental Status: She is alert and oriented to person, place, and time.      Assessment and Plan:  [name] is a 25 y.o. female presenting to the [facility] Department for STI screening  1. Screening examination for venereal disease - Chlamydia/Gonorrhea [facility] - Syphilis Serology, [facility] - HIV [place] [facility] - WET PREP FOR TRICH, YEAST, CLUE - Chlamydia/Gonorrhea [place]  Patient accepted all screenings including oral, vaginal CT/GC and bloodwork for HIV/RPR.  Patient meets criteria for HepB screening? No. Ordered? No - declines Patient meets criteria for HepC screening? No.  Ordered? No - declines   Wet prep results neg  No Treatment needed  Discussed time line for [facility] results and that patient will be called with positive results and encouraged patient to call if she had not heard in 2 weeks.  Counseled to return or seek care for continued or worsening symptoms Recommended condom use with all sex  Patient is currently using *Nuvaring to prevent pregnancy.       No follow-ups on file.  No future appointments.  Wendi Snipes, FNP

## 2020-10-05 NOTE — Progress Notes (Signed)
Pt here for STD screening.  Wet mount results reviewed, no treatment required.  Pt declined condoms. Amye Grego M Maddalena Linarez, RN  

## 2020-10-11 LAB — HM HIV SCREENING LAB: HM HIV Screening: NEGATIVE

## 2021-05-01 ENCOUNTER — Other Ambulatory Visit: Payer: Self-pay

## 2021-05-01 ENCOUNTER — Emergency Department: Payer: Medicaid Other

## 2021-05-01 ENCOUNTER — Emergency Department
Admission: EM | Admit: 2021-05-01 | Discharge: 2021-05-01 | Disposition: A | Discharge status: Home / Self Care | Payer: Medicaid Other | Attending: Emergency Medicine | Admitting: Emergency Medicine

## 2021-05-01 DIAGNOSIS — Z3A18 18 weeks gestation of pregnancy: Secondary | ICD-10-CM | POA: Diagnosis not present

## 2021-05-01 DIAGNOSIS — R102 Pelvic and perineal pain: Secondary | ICD-10-CM | POA: Insufficient documentation

## 2021-05-01 DIAGNOSIS — R519 Headache, unspecified: Secondary | ICD-10-CM | POA: Insufficient documentation

## 2021-05-01 DIAGNOSIS — Z3492 Encounter for supervision of normal pregnancy, unspecified, second trimester: Secondary | ICD-10-CM

## 2021-05-01 DIAGNOSIS — O26892 Other specified pregnancy related conditions, second trimester: Secondary | ICD-10-CM | POA: Diagnosis present

## 2021-05-01 DIAGNOSIS — R109 Unspecified abdominal pain: Secondary | ICD-10-CM | POA: Diagnosis not present

## 2021-05-01 LAB — BASIC METABOLIC PANEL
Anion gap: 6 (ref 5–15)
BUN: 12 mg/dL (ref 6–20)
CO2: 21 mmol/L — ABNORMAL LOW (ref 22–32)
Calcium: 8.9 mg/dL (ref 8.9–10.3)
Chloride: 105 mmol/L (ref 98–111)
Creatinine, Ser: 0.42 mg/dL — ABNORMAL LOW (ref 0.44–1.00)
GFR, Estimated: >60 mL/min (ref >60)
Glucose, Bld: 80 mg/dL (ref 70–99)
Potassium: 3.8 mmol/L (ref 3.5–5.1)
Sodium: 132 mmol/L — ABNORMAL LOW (ref 135–145)

## 2021-05-01 LAB — CBC
HCT: 35.4 % — ABNORMAL LOW (ref 36.0–46.0)
Hemoglobin: 12.1 g/dL (ref 12.0–15.0)
MCH: 30.4 pg (ref 26.0–34.0)
MCHC: 34.2 g/dL (ref 30.0–36.0)
MCV: 88.9 fL (ref 80.0–100.0)
Platelets: 192 10*3/uL (ref 150–400)
RBC: 3.98 MIL/uL (ref 3.87–5.11)
RDW: 12.5 % (ref 11.5–15.5)
WBC: 6.3 10*3/uL (ref 4.0–10.5)
nRBC: 0 % (ref 0.0–0.2)

## 2021-05-01 LAB — URINALYSIS, COMPLETE (UACMP) WITH MICROSCOPIC
Bacteria, UA: NONE SEEN
Bilirubin Urine: NEGATIVE
Glucose, UA: NEGATIVE mg/dL
Hgb urine dipstick: NEGATIVE
Ketones, ur: NEGATIVE mg/dL
Nitrite: NEGATIVE
Protein, ur: NEGATIVE mg/dL
Specific Gravity, Urine: 1.021 (ref 1.005–1.030)
pH: 7 (ref 5.0–8.0)

## 2021-05-01 LAB — LIPASE, BLOOD: Lipase: 29 U/L (ref 11–51)

## 2021-05-01 LAB — POC URINE PREG, ED: Preg Test, Ur: POSITIVE — AB

## 2021-05-01 MED ORDER — DIPHENHYDRAMINE HCL 25 MG PO CAPS
25.0000 mg | ORAL_CAPSULE | Freq: Once | ORAL | Status: AC
  Administered 2021-05-01: 19:00:00 25 mg via ORAL
  Filled 2021-05-01: qty 1

## 2021-05-01 MED ORDER — ACETAMINOPHEN 500 MG PO TABS
1000.0000 mg | ORAL_TABLET | Freq: Once | ORAL | Status: AC
  Administered 2021-05-01: 19:00:00 1000 mg via ORAL
  Filled 2021-05-01: qty 2

## 2021-05-01 MED ORDER — DIPHENHYDRAMINE HCL 25 MG PO CAPS: 50.0000 mg | ORAL_CAPSULE | Freq: Once | ORAL | Status: DC

## 2021-05-01 NOTE — ED Provider Notes (Signed)
[facility] EMERGENCY DEPARTMENT Provider Note   CSN: [id number] Arrival date & time: 05/01/21  1605     History Chief Complaint  Patient presents with   Headache    Ebony [name] is a 25 y.o. female presents the emergency department for evaluation of headache, abdominal cramping.  Patient is [redacted] weeks pregnant.  She is G5 P1, no complications with most recent pregnancy.  She denies any trauma or injury.  No vaginal bleeding or discharge.  Her pain is 6 out of 10 in the lower pelvic area.  She denies any back pain or urinary symptoms.  No fevers chills.  She describes headache x2 days, no vision changes, photophobia, nausea or vomiting.  Took Tylenol yesterday with mild relief  HPI     Past Medical History:  Diagnosis Date   Acne    Eczema, allergic    History of tonsillectomy     Patient Active Problem List   Diagnosis Date Noted   Decreased fetal movement 12/20/2017   Blighted ovum 05/27/2017   Obesity, pediatric, BMI 95th to 98th percentile for age 76/15/2016   Intermenstrual bleeding 12/19/2014   Allergic contact dermatitis 12/17/2014    Past Surgical History:  Procedure Laterality Date   ADENOIDECTOMY     DILATION AND EVACUATION N/A 05/29/2017   Procedure: DILATATION AND EVACUATION;  Surgeon: Will Bonnet, MD;  Location: [facility] ORS;  Service: Gynecology;  Laterality: N/A;   TONSILLECTOMY     WISDOM TOOTH EXTRACTION       OB History     Gravida  5   Para  1   Term  1   Preterm      AB      Living  1      SAB      IAB      Ectopic      Multiple  0   Live Births  1           Family History  Problem Relation Age of Onset   Diabetes Brother     Social History   Tobacco Use   Smoking status: Never   Smokeless tobacco: Never  Vaping Use   Vaping Use: Never used  Substance Use Topics   Alcohol use: Yes    Alcohol/week: 0.0 standard drinks    Comment: occ   Drug use: Not Currently    Types: Marijuana     Comment: last used 2019     Home Medications Prior to Admission medications   Medication Sig Start Date End Date Taking? Authorizing Provider  benzonatate (TESSALON) 100 MG capsule Take 1 capsule (100 mg total) by mouth every 8 (eight) hours as needed for cough. Patient not taking: Reported on 10/05/2020 07/02/20   Loni Beckwith, PA-C  phenazopyridine (PYRIDIUM) 200 MG tablet Take 1 tablet (200 mg total) by mouth 3 (three) times daily as needed for pain. Patient not taking: Reported on 10/05/2020 09/20/20   Sable Feil, PA-C  sodium chloride (OCEAN) 0.65 % SOLN nasal spray Place 1 spray into both nostrils as needed for congestion. 02/09/18 06/05/20  Laban Emperor, PA-C    Allergies    Patient has no known allergies.  Review of Systems   Review of Systems  Constitutional:  Negative for chills and fever.  HENT:  Negative for sore throat.   Respiratory:  Negative for cough and shortness of breath.   Cardiovascular:  Negative for chest pain.  Gastrointestinal:  Negative for abdominal pain,  nausea and vomiting.  Genitourinary:  Positive for pelvic pain (cramping). Negative for decreased urine volume, difficulty urinating, dysuria, hematuria, vaginal bleeding, vaginal discharge and vaginal pain.  Musculoskeletal:  Negative for arthralgias, back pain and gait problem.  Skin:  Negative for rash and wound.  Neurological:  Positive for headaches. Negative for dizziness, tremors, syncope, facial asymmetry, speech difficulty and light-headedness.  Psychiatric/Behavioral:  Negative for confusion and decreased concentration.    Physical Exam Updated Vital Signs BP 120/75    Pulse 88    Temp 98.6 F (37 C) (Oral)    Resp 20    Ht 5\' 6"  (1.676 m)    Wt 81.6 kg    LMP 12/30/2020    SpO2 92%    BMI 29.05 kg/m   Physical Exam Constitutional:      Appearance: She is well-developed.  HENT:     Head: Normocephalic and atraumatic.     Right Ear: External ear normal.     Left Ear: External  ear normal.     Nose: Nose normal.     Mouth/Throat:     Mouth: Mucous membranes are moist.     Pharynx: No oropharyngeal exudate or posterior oropharyngeal erythema.  Eyes:     Conjunctiva/sclera: Conjunctivae normal.  Cardiovascular:     Rate and Rhythm: Normal rate.  Pulmonary:     Effort: Pulmonary effort is normal. No respiratory distress.     Breath sounds: Normal breath sounds.  Abdominal:     General: Abdomen is flat. There is no distension.     Tenderness: There is no abdominal tenderness. There is no guarding.  Musculoskeletal:        General: Normal range of motion.     Cervical back: Normal range of motion and neck supple.  Skin:    General: Skin is warm.     Findings: No rash.  Neurological:     General: No focal deficit present.     Mental Status: She is alert and oriented to person, place, and time.     Cranial Nerves: No cranial nerve deficit.     Motor: No weakness.     Gait: Gait normal.  Psychiatric:        Mood and Affect: Mood normal.        Behavior: Behavior normal.        Thought Content: Thought content normal.    ED Results / Procedures / Treatments   Labs (all labs ordered are listed, but only abnormal results are displayed) Labs Reviewed  CBC - Abnormal; Notable for the following components:      Result Value   HCT 35.4 (*)    All other components within normal limits  BASIC METABOLIC PANEL - Abnormal; Notable for the following components:   Sodium 132 (*)    CO2 21 (*)    Creatinine, Ser 0.42 (*)    All other components within normal limits  URINALYSIS, COMPLETE (UACMP) WITH MICROSCOPIC - Abnormal; Notable for the following components:   Color, Urine YELLOW (*)    APPearance HAZY (*)    Leukocytes,Ua TRACE (*)    All other components within normal limits  POC URINE PREG, ED - Abnormal; Notable for the following components:   Preg Test, Ur POSITIVE (*)    All other components within normal limits  LIPASE, BLOOD     EKG None  Radiology 01/01/2021 OB Limited  Result Date: 05/01/2021 CLINICAL DATA:  Lower abdominal pain starting 2 days ago. No bleeding. Estimated  gestational age by LMP is 17 weeks 3 days. EXAM: LIMITED OBSTETRIC ULTRASOUND COMPARISON:  04/03/2020 FINDINGS: Number of Fetuses: 1 Heart Rate:  153 bpm Movement: Fetal movement is observed. Presentation: Cephalic presentation. Placental Location: Anterior Previa: None Amniotic Fluid (Subjective):  Within normal limits. AFI: 4.6 cm BPD: 4 cm 18 w  1 d MATERNAL FINDINGS: Cervix:  Cervical length measures 4.6 cm.  Cervix is closed. Uterus/Adnexae: Limited visualization of the uterus is unremarkable. No adnexal masses are indicated. IMPRESSION: No acute complication is demonstrated on limited sonography. This exam is performed on an emergent basis and does not comprehensively evaluate fetal size, dating, or anatomy; follow-up complete OB US should be considered if further fetal assessment is warranted. Electronically Signed   By: Lucienne Capers M.D.   On: 05/01/2021 19:59    Procedures Procedures   Medications Ordered in ED Medications  acetaminophen (TYLENOL) tablet 1,000 mg (1,000 mg Oral Given 05/01/21 1926)  diphenhydrAMINE (BENADRYL) capsule 25 mg (25 mg Oral Given 05/01/21 1926)    ED Course  I have reviewed the triage vital signs and the nursing notes.  Pertinent labs & imaging results that were available during my care of the patient were reviewed by me and considered in my medical decision making (see chart for details).    MDM Rules/Calculators/A&P                         25 year old female G5, P1, [redacted] weeks pregnant with complaint of headache and abdominal cramping.  Headache improved with Tylenol and Benadryl.  No neurological deficits.  Blood pressure and vital signs stable.  Pulse 77 at discharge and O2 sats 99%.  No chest pain or shortness of breath.  She has no vaginal bleeding or discharge, urinalysis is showing no signs of  infection.  Ultrasound OB showed no abnormalities with normal heart rate.  Patient educated on signs and symptoms return to the ER for.  She will continue with Tylenol as needed.  Final Clinical Impression(s) / ED Diagnoses Final diagnoses:  Second trimester pregnancy  Acute nonintractable headache, unspecified headache type  Abdominal cramping    Rx / DC Orders ED Discharge Orders     None        Renata Caprice 05/01/21 2025    Delman Kitten, MD 05/01/21 2142

## 2021-05-01 NOTE — ED Provider Notes (Signed)
Emergency Medicine Provider Triage Evaluation Note  Ebony [name], a 25 y.o. female G5P1 was evaluated in triage.  Pt complains of headache.  Patient is [redacted] weeks gestation with current single IUP.  She notes some lower abdominal cramping but denies any vaginal discharge or vaginal bleeding.  She denies any vision change, weakness, vertigo, or dizziness.  She is been taking Tylenol for her headache with limited benefit.  Review of Systems  Positive: Headache, abd cramping Negative: FCS, vaginal bleeding  Physical Exam  BP 120/75    Pulse 88    Temp 98.6 F (37 C) (Oral)    Resp 20    Ht 5\' 6"  (1.676 m)    Wt 81.6 kg    LMP 12/30/2020    SpO2 92%    BMI 29.05 kg/m  Gen:   Awake, no distress  NAD Resp:  Normal effort CTA MSK:   Moves extremities without difficulty  Other:  CVS: RRR  Medical Decision Making  Medically screening exam initiated at 5:59 PM.  Appropriate orders placed.  JAYNE PECKENPAUGH was informed that the remainder of the evaluation will be completed by another provider, this initial triage assessment does not replace that evaluation, and the importance of remaining in the ED until their evaluation is complete.  Patient at [redacted] weeks gestation, presents with intermittent headache and lower abdominal cramping.  She denies any pregnancy related complaints at this time.   Ronnell Freshwater, PA-C 05/01/21 05/03/21    Flossie Buffy, MD 05/01/21 05/03/21

## 2021-05-01 NOTE — ED Triage Notes (Signed)
Pt to ED for headache for the past 2 days, states can only take tylenol d/t [redacted] weeks pregnant.  Also c/o lower abd pain that started 2 days ago. Denies vaginal bleeding, n/v/d.

## 2021-05-01 NOTE — Discharge Instructions (Signed)
Please continue with Tylenol every 6 hours as needed.  Return to the ER for any fevers increased pain, vaginal bleeding, worsening symptoms or urgent changes in your health.

## 2021-05-07 ENCOUNTER — Emergency Department: Payer: Medicaid Other

## 2021-05-07 ENCOUNTER — Other Ambulatory Visit: Payer: Self-pay

## 2021-05-07 ENCOUNTER — Encounter: Payer: Self-pay | Admitting: Emergency Medicine

## 2021-05-07 ENCOUNTER — Emergency Department
Admission: EM | Admit: 2021-05-07 | Discharge: 2021-05-07 | Disposition: A | Discharge status: Home / Self Care | Payer: Medicaid Other | Attending: Emergency Medicine | Admitting: Emergency Medicine

## 2021-05-07 DIAGNOSIS — Z3A18 18 weeks gestation of pregnancy: Secondary | ICD-10-CM | POA: Diagnosis not present

## 2021-05-07 DIAGNOSIS — O26892 Other specified pregnancy related conditions, second trimester: Secondary | ICD-10-CM | POA: Diagnosis present

## 2021-05-07 DIAGNOSIS — U071 COVID-19: Secondary | ICD-10-CM | POA: Diagnosis not present

## 2021-05-07 DIAGNOSIS — O98512 Other viral diseases complicating pregnancy, second trimester: Secondary | ICD-10-CM | POA: Insufficient documentation

## 2021-05-07 LAB — RESP PANEL BY RT-PCR (FLU A&B, COVID) ARPGX2
Influenza A by PCR: NEGATIVE
Influenza B by PCR: NEGATIVE
SARS Coronavirus 2 by RT PCR: POSITIVE — AB

## 2021-05-07 LAB — GROUP A STREP BY PCR: Group A Strep by PCR: NOT DETECTED

## 2021-05-07 MED ORDER — ACETAMINOPHEN 500 MG PO TABS
1000.0000 mg | ORAL_TABLET | Freq: Once | ORAL | Status: DC
  Filled 2021-05-07: qty 2

## 2021-05-07 MED ORDER — BENZONATATE 100 MG PO CAPS: 100.0000 mg | ORAL_CAPSULE | Freq: Three times a day (TID) | ORAL | 0 refills | Status: AC | PRN

## 2021-05-07 MED ORDER — AMOXICILLIN 875 MG PO TABS: 875.0000 mg | ORAL_TABLET | Freq: Two times a day (BID) | ORAL | 0 refills | Status: AC

## 2021-05-07 NOTE — ED Triage Notes (Signed)
Pt via POV from home. Pt was seen 12/27 for headache. Pt states she was given Tylenol and Benadryl with no relief. 3 days ago she started having cough, sore throat, and nasal congestion. Pt is A&Ox4 and NAD.

## 2021-05-07 NOTE — ED Provider Notes (Signed)
[facility] Provider Note  Patient Contact: 10:26 PM (approximate)   History   Headache, Sore Throat, and Fever   HPI  Ebony [name] is a 26 y.o. female G5 P1 currently [redacted] weeks pregnant presents to the emergency department with COVID-19 symptoms.  She reports that she has been symptomatic for nearly 2 weeks.  She was seen and evaluated on 1227 with reassuring labs and dedicated OB ultrasound.  She states that she has continued cough but has been afebrile.  No current chest pain, chest tightness or shortness of breath.  No vaginal bleeding or pelvic pain.      Physical Exam   Triage Vital Signs: ED Triage Vitals [05/07/21 1732]  Enc Vitals Group     BP 125/77     Pulse Rate (!) 110     Resp 20     Temp 99.1 F (37.3 C)     Temp Source Oral     SpO2 97 %     Weight 170 lb (77.1 kg)     Height 5\' 6"  (1.676 m)     Head Circumference      Peak Flow      Pain Score 8     Pain Loc      Pain Edu?      Excl. in GC?     Most recent vital signs: Vitals:   05/07/21 1732  BP: 125/77  Pulse: (!) 110  Resp: 20  Temp: 99.1 F (37.3 C)  SpO2: 97%     General: Alert and in no acute distress. Eyes:  PERRL. EOMI. Head: No acute traumatic findings ENT:      Ears:       Nose: No congestion/rhinnorhea.      Mouth/Throat: Mucous membranes are moist. Neck: No stridor. No cervical spine tenderness to palpation.  Cardiovascular:  Good peripheral perfusion Respiratory: Normal respiratory effort without tachypnea or retractions. Lungs CTAB. Good air entry to the bases with no decreased or absent breath sounds. Gastrointestinal: Bowel sounds 4 quadrants. Soft and nontender to palpation. No guarding or rigidity. No palpable masses. No distention. No CVA tenderness.  Fetal heart tones assessed by myself at 160 bpm. Musculoskeletal: Full range of motion to all extremities.  Neurologic:  No gross focal neurologic deficits are appreciated.  Skin:   No rash  noted Other:   ED Results / Procedures / Treatments   Labs (all labs ordered are listed, but only abnormal results are displayed) Labs Reviewed  RESP PANEL BY RT-PCR (FLU A&B, COVID) ARPGX2 - Abnormal; Notable for the following components:      Result Value   SARS Coronavirus 2 by RT PCR POSITIVE (*)    All other components within normal limits  GROUP A STREP BY PCR         MEDICATIONS ORDERED IN ED: Medications  acetaminophen (TYLENOL) tablet 1,000 mg (has no administration in time range)     IMPRESSION / MDM / ASSESSMENT AND PLAN / ED COURSE  I reviewed the triage vital signs and the nursing notes.                              Differential diagnosis includes, but is not limited to, influenza, COVID-19, bronchitis, community-acquired pneumonia  Assessment and plan COVID-42 26 year old female currently [redacted] weeks pregnant presents to the emergency department with concern for viral URI-like symptoms for the past 2 weeks.  Patient was mildly  tachycardic at triage but vital signs were otherwise reassuring.  She was alert, active and nontoxic-appearing with no adventitious lung sounds.  Fetal heart tones were obtained by myself at 160 bpm.  I was concerned that patient has been symptomatic with cough for the past 2 weeks.  COVID-19, influenza and group A strep testing results were obtained and COVID-19 testing was positive.  Due to long duration of cough, will treat patient with amoxicillin twice daily for 10 days for early post viral pneumonia.  Return precautions were given to return to the emergency department with pelvic pain, vaginal bleeding or other new or worsening symptoms.      FINAL CLINICAL IMPRESSION(S) / ED DIAGNOSES   Final diagnoses:  COVID-19     Rx / DC Orders   ED Discharge Orders          Ordered    amoxicillin (AMOXIL) 875 MG tablet  2 times daily        05/07/21 2135    benzonatate (TESSALON PERLES) 100 MG capsule  3 times daily PRN         05/07/21 2136             Note:  This document was prepared using Dragon voice recognition software and may include unintentional dictation errors.   Pia Mau Braymer, PA-C 05/07/21 2230    Sharyn Creamer, MD 05/07/21 2314

## 2021-05-07 NOTE — Discharge Instructions (Addendum)
Take Amoxicillin twice a day for ten days. Take 650 mg of Tylenol every four hours.

## 2021-05-07 NOTE — ED Provider Notes (Signed)
°  Emergency Medicine Provider Triage Evaluation Note  Ebony [name] , a 26 y.o.female,  was evaluated in triage.  Pt complains of cough, sore throat, nasal congestion for the past 3 days.  Additionally endorses some chest tightness.  Denies fever/chills, abdominal pain, back pain, or urinary symptoms.   Review of Systems  Positive: Cough, sore throat, nasal congestion. Negative: Denies fever, chest pain, vomiting  Physical Exam   Vitals:   05/07/21 1732  BP: 125/77  Pulse: (!) 110  Resp: 20  Temp: 99.1 F (37.3 C)  SpO2: 97%   Gen:   Awake, no distress   Resp:  Normal effort  MSK:   Moves extremities without difficulty  Other:    Medical Decision Making  Given the patient's initial medical screening exam, the following diagnostic evaluation has been ordered. The patient will be placed in the appropriate treatment space, once one is available, to complete the evaluation and treatment. I have discussed the plan of care with the patient and I have advised the patient that an ED physician or mid-level practitioner will reevaluate their condition after the test results have been received, as the results may give them additional insight into the type of treatment they may need.    Diagnostics: Respiratory panel, strep PCR  Treatments: none immediately   Varney Daily, PA 05/07/21 1735    Shaune Pollack, MD 05/08/21 [phone]

## 2021-05-28 ENCOUNTER — Encounter: Payer: Self-pay | Admitting: Obstetrics and Gynecology

## 2021-05-28 ENCOUNTER — Other Ambulatory Visit: Payer: Self-pay

## 2021-05-28 ENCOUNTER — Observation Stay
Admission: EM | Admit: 2021-05-28 | Discharge: 2021-05-28 | Disposition: A | Discharge status: Home / Self Care | Payer: Medicaid Other | Attending: Obstetrics and Gynecology | Admitting: Obstetrics and Gynecology

## 2021-05-28 DIAGNOSIS — R102 Pelvic and perineal pain: Secondary | ICD-10-CM | POA: Diagnosis present

## 2021-05-28 DIAGNOSIS — Z3A21 21 weeks gestation of pregnancy: Secondary | ICD-10-CM | POA: Diagnosis not present

## 2021-05-28 DIAGNOSIS — O26892 Other specified pregnancy related conditions, second trimester: Secondary | ICD-10-CM | POA: Diagnosis present

## 2021-05-28 HISTORY — DX: Other specified health status: Z78.9

## 2021-05-28 LAB — WET PREP, GENITAL
Clue Cells Wet Prep HPF POC: NONE SEEN
Sperm: NONE SEEN
Trich, Wet Prep: NONE SEEN
WBC, Wet Prep HPF POC: >=10 — AB (ref <10)

## 2021-05-28 LAB — URINALYSIS, COMPLETE (UACMP) WITH MICROSCOPIC
Bacteria, UA: NONE SEEN
Bilirubin Urine: NEGATIVE
Glucose, UA: NEGATIVE mg/dL
Hgb urine dipstick: NEGATIVE
Ketones, ur: 15 mg/dL — AB
Leukocytes,Ua: NEGATIVE
Nitrite: NEGATIVE
Protein, ur: NEGATIVE mg/dL
Specific Gravity, Urine: 1.02 (ref 1.005–1.030)
pH: 7 (ref 5.0–8.0)

## 2021-05-28 LAB — CHLAMYDIA/NGC RT PCR (ARMC ONLY)
Chlamydia Tr: NOT DETECTED
N gonorrhoeae: NOT DETECTED

## 2021-05-28 NOTE — OB Triage Note (Signed)
Pt presented to L/D triage with reported vaginal pressure that began last night at work. She describes the pain as intermittent, rated 7/10. Pt states the pain is not positional and is unrelieved by rest. She reports occasional pain with urination. She reports "normal" vaginal discharge with no bleeding or LOF. No recent intercourse. Abdomen soft and nontender.  FHT 154. VSS.

## 2021-05-28 NOTE — Discharge Summary (Signed)
Ebony [name] is a 26 y.o. female. She is at [redacted]w[redacted]d gestation. Patient's last menstrual period was 12/30/2020. Estimated Date of Delivery: 10/03/21  Prenatal care site: Unassigned, no [facility], has not initiated care at [facility], but plans to deliver there.   Current pregnancy complicated by: no PNC; Hx Pre-E last preg.   Chief complaint: reported vaginal pressure that began last night at work. She describes the pain as intermittent, rated 7/10. Pt states the pain is not positional and is unrelieved by rest. She reports occasional pain with urination.    S: Resting comfortably. no CTX, no VB.no LOF,  Active fetal movement. Denies: HA, visual changes, SOB, or RUQ/epigastric pain  Maternal Medical History:   Past Medical History:  Diagnosis Date   Acne    Eczema, allergic    History of tonsillectomy    Medical history non-contributory     Past Surgical History:  Procedure Laterality Date   ADENOIDECTOMY     DILATION AND EVACUATION N/A 05/29/2017   Procedure: DILATATION AND EVACUATION;  Surgeon: Conard Novak, MD;  Location: [facility] ORS;  Service: Gynecology;  Laterality: N/A;   TONSILLECTOMY     WISDOM TOOTH EXTRACTION      No Known Allergies  Prior to Admission medications   Medication Sig Start Date End Date Taking? Authorizing Provider  phenazopyridine (PYRIDIUM) 200 MG tablet Take 1 tablet (200 mg total) by mouth 3 (three) times daily as needed for pain. Patient not taking: Reported on 10/05/2020 09/20/20   Joni Reining, PA-C  sodium chloride (OCEAN) 0.65 % SOLN nasal spray Place 1 spray into both nostrils as needed for congestion. 02/09/18 06/05/20  Enid Derry, PA-C      Social History: She  reports that she has never smoked. She has never used smokeless tobacco. She reports current alcohol use. She reports that she does not currently use drugs after having used the following drugs: Marijuana.  Family History: family history includes Diabetes in her brother.   Review of  Systems: A full review of systems was performed and negative except as noted in the HPI.     O:  BP 117/68 (BP Location: Right Arm)    Pulse 94    Temp 98.3 F (36.8 C) (Oral)    Resp 20    Ht 5\' 6"  (1.676 m)    Wt 77.1 kg    LMP 12/30/2020    BMI 27.44 kg/m  No results found for this or any previous visit (from the past 48 hour(s)).   Constitutional: NAD, AAOx3  HE/ENT: extraocular movements grossly intact, moist mucous membranes CV: RRR PULM: nl respiratory effort, CTABL     Abd: gravid, non-tender, non-distended, soft      Ext: Non-tender, Nonedematous   Psych: mood appropriate, speech normal Pelvic: SSE done- no erythema, closed cervix; small amount cloudy adherent DC.     Fetal  monitoring: FHR 150 via doppler  Toco: no uterine activity noted.     A/P: 26 y.o. [redacted]w[redacted]d here for antenatal surveillance for ligament pain, vaginal pressure.   Principle Diagnosis:  21wks, vaginal pressure, ligament pain.   Preterm labor: not present.  Advised hydration, support belt  Fetal Wellbeing: FHR via doppler.  D/c home stable, precautions reviewed, follow-up as scheduled.    11-04-1997, CNM 05/28/2021  5:09 PM

## 2021-05-28 NOTE — OB Triage Note (Signed)
Pt discharged home per order.  Pt stable and ambulatory and an After Visit Summary was printed and given to the patient. Discharge education completed with patient. Pt received labor and bleeding precautions. Patient able to verbalize understanding, all questions fully answered upon discharge. Patient instructed to return to ED, call 911, or call MD for any changes in condition.

## 2021-05-30 LAB — URINE CULTURE

## 2021-06-03 ENCOUNTER — Encounter: Payer: Self-pay | Admitting: Emergency Medicine

## 2021-06-03 ENCOUNTER — Other Ambulatory Visit: Payer: Self-pay

## 2021-06-03 ENCOUNTER — Emergency Department
Admission: EM | Admit: 2021-06-03 | Discharge: 2021-06-03 | Disposition: A | Discharge status: Home / Self Care | Payer: Medicaid Other | Attending: Emergency Medicine | Admitting: Emergency Medicine

## 2021-06-03 DIAGNOSIS — Z8616 Personal history of COVID-19: Secondary | ICD-10-CM | POA: Diagnosis not present

## 2021-06-03 DIAGNOSIS — Z3A21 21 weeks gestation of pregnancy: Secondary | ICD-10-CM | POA: Insufficient documentation

## 2021-06-03 DIAGNOSIS — O26892 Other specified pregnancy related conditions, second trimester: Secondary | ICD-10-CM | POA: Diagnosis present

## 2021-06-03 DIAGNOSIS — R519 Headache, unspecified: Secondary | ICD-10-CM | POA: Diagnosis not present

## 2021-06-03 NOTE — Discharge Instructions (Signed)
You have been seen in the emergency room today for intermittent headache.  You should treat this headache with Tylenol/Benadryl as discussed with you by previous provider. Strongly encourage you to call an OB/GYN provider and establish prenatal care as soon as possible.

## 2021-06-03 NOTE — ED Triage Notes (Signed)
Pt reports has been having a headache intermittently since the beginning of January. Pt reports took some tylenol so doesn't really hurt now but she wants to be checked out

## 2021-06-03 NOTE — ED Provider Notes (Signed)
[facility] Emergency Department Provider Note   ____________________________________________   Event Date/Time   First MD Initiated Contact with Patient 06/03/21 1846     (approximate)  I have reviewed the triage vital signs and the nursing notes.   HISTORY  Chief Complaint Migraine    HPI Ebony [name] is a 26 y.o. female patient reports that since being diagnosed with COVID first week of January she has been having intermittent headaches.  She states that the headaches are throbbing in nature and that there is no set pattern or timing.  Patient does not have known history of migraines. Patient reports that she has been told by her GYN provider to take Tylenol and Benadryl for these headaches.  She states that she is unable to take Benadryl because it makes her too sleepy and she is unable to care for her young child. Patient denies headache at this time.  She states that she has not had a headache today she just wanted to be evaluated. Pain is 0 out of 10 at this time.  Past Medical History:  Diagnosis Date   Acne    Eczema, allergic    History of tonsillectomy    Medical history non-contributory     Patient Active Problem List   Diagnosis Date Noted   Pelvic pressure in pregnancy, antepartum, second trimester 05/28/2021   Decreased fetal movement 12/20/2017   Blighted ovum 05/27/2017   Obesity, pediatric, BMI 95th to 98th percentile for age 26/15/2016   Intermenstrual bleeding 12/19/2014   Allergic contact dermatitis 12/17/2014    Past Surgical History:  Procedure Laterality Date   ADENOIDECTOMY     DILATION AND EVACUATION N/A 05/29/2017   Procedure: DILATATION AND EVACUATION;  Surgeon: Conard Novak, MD;  Location: [facility] ORS;  Service: Gynecology;  Laterality: N/A;   TONSILLECTOMY     WISDOM TOOTH EXTRACTION      Prior to Admission medications   Medication Sig Start Date End Date Taking? Authorizing Provider  phenazopyridine  (PYRIDIUM) 200 MG tablet Take 1 tablet (200 mg total) by mouth 3 (three) times daily as needed for pain. Patient not taking: Reported on 10/05/2020 09/20/20   Joni Reining, PA-C  sodium chloride (OCEAN) 0.65 % SOLN nasal spray Place 1 spray into both nostrils as needed for congestion. 02/09/18 06/05/20  Enid Derry, PA-C    Allergies Patient has no known allergies.  Family History  Problem Relation Age of Onset   Diabetes Brother     Social History Social History   Tobacco Use   Smoking status: Never   Smokeless tobacco: Never  Vaping Use   Vaping Use: Never used  Substance Use Topics   Alcohol use: Yes    Alcohol/week: 0.0 standard drinks    Comment: occ   Drug use: Not Currently    Types: Marijuana    Comment: last used 2019     Review of Systems  Constitutional: No fever/chills Eyes: No visual changes. ENT: No sore throat. Cardiovascular: Denies chest pain. Respiratory: Denies shortness of breath. Gastrointestinal: No abdominal pain.  No nausea, no vomiting.  No diarrhea.  No constipation. Genitourinary: Negative for dysuria. Musculoskeletal: Negative for back pain. Skin: Negative for rash. Neurological: Negative for headaches, focal weakness or numbness.   ____________________________________________   PHYSICAL EXAM:  VITAL SIGNS: ED Triage Vitals  Enc Vitals Group     BP 06/03/21 1837 116/80     Pulse Rate 06/03/21 1837 98     Resp 06/03/21  1837 18     Temp 06/03/21 1837 98.7 F (37.1 C)     Temp Source 06/03/21 1837 Oral     SpO2 06/03/21 1837 97 %     Weight 06/03/21 1830 171 lb 15.3 oz (78 kg)     Height 06/03/21 1830 5\' 6"  (1.676 m)     Head Circumference --      Peak Flow --      Pain Score 06/03/21 1829 0     Pain Loc --      Pain Edu? --      Excl. in GC? --     Constitutional: Alert and oriented. Well appearing and in no acute distress. Eyes: Conjunctivae are normal. PERRL. EOMI. Head: Atraumatic. Nose: No  congestion/rhinnorhea. Mouth/Throat: Mucous membranes are moist.  Oropharynx non-erythematous. Neck: No stridor.   Cardiovascular: Normal rate, regular rhythm. Grossly normal heart sounds.  Good peripheral circulation. Respiratory: Normal respiratory effort.  No retractions. Lungs CTAB. Gastrointestinal: Soft and nontender. No distention. No abdominal bruits. No CVA tenderness. Musculoskeletal: No lower extremity tenderness nor edema.  No joint effusions. Neurologic:  Normal speech and language. No gross focal neurologic deficits are appreciated. No gait instability. Skin:  Skin is warm, dry and intact. No rash noted. Psychiatric: Mood and affect are normal. Speech and behavior are normal.  ____________________________________________   LABS (all labs ordered are listed, but only abnormal results are displayed)  Labs Reviewed - No data to display ____________________________________________  EKG   ____________________________________________  RADIOLOGY  ED MD interpretation:    Official radiology report(s): No results found.  ____________________________________________   PROCEDURES  Procedure(s) performed: None  Procedures  Critical Care performed: No  ____________________________________________   INITIAL IMPRESSION / ASSESSMENT AND PLAN / ED COURSE    Ebony [name] is a 26 y.o. female patient reports that since being diagnosed with COVID first week of January she has been having intermittent headaches.  She states that the headaches are throbbing in nature and that there is no set pattern or timing.  Patient does not have known history of migraines. Patient reports that she has been told by her GYN provider to take Tylenol and Benadryl for these headaches.  She states that she is unable to take Benadryl because it makes her too sleepy and she is unable to care for her young child. Patient denies headache at this time.  She states that she has not had a  headache today she just wanted to be evaluated. Pain is 0 out of 10 at this time.  On review of patient's chart it is noted that she does not have an OB/GYN and has not had any prenatal care up to this point in pregnancy.   It was also noted that in October she was offered appointment through [facility] but never scheduled as well. She did have admission to the hospital January 23 of this year and was to follow-up with [facility].  She has not made this appointment as of yet. At this admission she was noted to be 21 weeks 5 days pregnant. Fetal heart tones today are 142  Patient's blood pressure is 116/80 so preeclampsia is not a concern for cause of headache at this time.  I have discussed lingering symptoms of COVID with headache being one of them. I have also discussed with her that preeclampsia can cause headaches is not a concern today. I have reiterated with patient that appropriate treatment for her would be Tylenol/Benadryl as she is currently pregnant.  Due to the fact that patient is not having an active migraine/headache at this time there is no treatment indicated during this visit and there is no work-up indicated for this visit. Her exam is normal at this time. I will refer her care back to her OB/GYN provider for management of intermittent headaches  Patient is discharged in stable condition in no acute distress at this time. She is encouraged to schedule OB appt for prenatal care       ____________________________________________   FINAL CLINICAL IMPRESSION(S) / ED DIAGNOSES  Final diagnoses:  Nonintractable headache, unspecified chronicity pattern, unspecified headache type     ED Discharge Orders     None        Note:  This document was prepared using Dragon voice recognition software and may include unintentional dictation errors.     Herschell DimesClapp, Kairo Laubacher J, NP 06/03/21 Barnie [id number]    Minna AntisPaduchowski, Kevin, MD 06/03/21 [phone]

## 2021-07-19 DIAGNOSIS — E669 Obesity, unspecified: Secondary | ICD-10-CM | POA: Insufficient documentation

## 2021-08-13 ENCOUNTER — Other Ambulatory Visit: Payer: Self-pay

## 2021-08-13 ENCOUNTER — Emergency Department
Admission: EM | Admit: 2021-08-13 | Discharge: 2021-08-13 | Disposition: A | Discharge status: Home / Self Care | Payer: Medicaid Other | Attending: Emergency Medicine | Admitting: Emergency Medicine

## 2021-08-13 DIAGNOSIS — J029 Acute pharyngitis, unspecified: Secondary | ICD-10-CM | POA: Diagnosis not present

## 2021-08-13 DIAGNOSIS — O26893 Other specified pregnancy related conditions, third trimester: Secondary | ICD-10-CM | POA: Diagnosis present

## 2021-08-13 DIAGNOSIS — O99513 Diseases of the respiratory system complicating pregnancy, third trimester: Secondary | ICD-10-CM | POA: Diagnosis not present

## 2021-08-13 DIAGNOSIS — Z3A32 32 weeks gestation of pregnancy: Secondary | ICD-10-CM | POA: Insufficient documentation

## 2021-08-13 LAB — GROUP A STREP BY PCR: Group A Strep by PCR: NOT DETECTED

## 2021-08-13 NOTE — Discharge Instructions (Signed)
Your strep screen is negative. Please take cetirizine daily. ? ?Follow-up with your OB/GYN if not improving over the next few days. ?

## 2021-08-13 NOTE — ED Triage Notes (Signed)
Patient c/o sore throat that started Thursday. Patient is [redacted] weeks pregnant. Patient denies pain or vaginal spotting/bleeding ?

## 2021-08-13 NOTE — ED Provider Notes (Signed)
[facility] ?Emergency Department Provider Note ? ?____________________________________________ ? ?Time seen: Approximately 1:42 PM ? ?I have reviewed the triage vital signs and the nursing notes. ? ? ?HISTORY ? ?Chief Complaint ?Sore Throat ? ? ? ?HPI ?Ebony [name] is a 26 y.o. female with no significant past medical history presents to the emergency department for treatment and evaluation of sore throat.  Symptoms started 4 days ago.  No relief with Tylenol. She is [redacted] weeks pregnant--no pregnancy related complaints or concerns today.  ? ?Past Medical History:  ?Diagnosis Date  ? Acne   ? Eczema, allergic   ? History of tonsillectomy   ? Medical history non-contributory   ? ? ?Patient Active Problem List  ? Diagnosis Date Noted  ? Pelvic pressure in pregnancy, antepartum, second trimester 05/28/2021  ? Decreased fetal movement 12/20/2017  ? Blighted ovum 05/27/2017  ? Obesity, pediatric, BMI 95th to 98th percentile for age 88/15/2016  ? Intermenstrual bleeding 12/19/2014  ? Allergic contact dermatitis 12/17/2014  ? ? ?Past Surgical History:  ?Procedure Laterality Date  ? ADENOIDECTOMY    ? DILATION AND EVACUATION N/A 05/29/2017  ? Procedure: DILATATION AND EVACUATION;  Surgeon: Will Bonnet, MD;  Location: [facility] ORS;  Service: Gynecology;  Laterality: N/A;  ? TONSILLECTOMY    ? WISDOM TOOTH EXTRACTION    ? ? ?Prior to Admission medications   ?Medication Sig Start Date End Date Taking? Authorizing Provider  ?phenazopyridine (PYRIDIUM) 200 MG tablet Take 1 tablet (200 mg total) by mouth 3 (three) times daily as needed for pain. ?Patient not taking: Reported on 10/05/2020 09/20/20   Sable Feil, PA-C  ?sodium chloride (OCEAN) 0.65 % SOLN nasal spray Place 1 spray into both nostrils as needed for congestion. 02/09/18 06/05/20  Laban Emperor, PA-C  ? ? ?Allergies ?Patient has no known allergies. ?____________________________________________ ? ? ?PHYSICAL EXAM: ? ?VITAL SIGNS: ?ED Triage  Vitals  ?Enc Vitals Group  ?   BP 08/13/21 1327 126/76  ?   Pulse Rate 08/13/21 1327 97  ?   Resp 08/13/21 1327 18  ?   Temp 08/13/21 1327 98.2 ?F (36.8 ?C)  ?   Temp Source 08/13/21 1327 Oral  ?   SpO2 08/13/21 1327 94 %  ?   Weight 08/13/21 1326 220 lb (99.8 kg)  ?   Height 08/13/21 1326 5\' 5"  (1.651 m)  ?   Head Circumference --   ?   Peak Flow --   ?   Pain Score 08/13/21 1326 0  ?   Pain Loc --   ?   Pain Edu? --   ?   Excl. in [place]? --   ? ? ?Constitutional: Alert and oriented. Well appearing and in no acute distress. ?Eyes: Conjunctivae are normal.  ?Head: Atraumatic. ?Nose: No congestion/rhinnorhea. ?Mouth/Throat: Mucous membranes are moist.  Oropharynx erythematous with cobblestone exudate, tonsils absent/flat without exudate. Uvula is midline. ?Neck: No stridor. Voice normal ?Lymphatic: Anterior cervical nodes nontender ?Cardiovascular: Normal rate, regular rhythm. Good peripheral circulation. ?Respiratory: Normal respiratory effort. Lungs CTAB. ?Gastrointestinal: Soft and nontender. ?Musculoskeletal: FROM of neck, upper and lower extremities. ?Neurologic:  Normal speech and language. No gross focal neurologic deficits are appreciated. ?Skin:  Skin is warm, dry and intact. no rash noted ?Psychiatric: Mood and affect are normal. Speech and behavior are normal. ? ?____________________________________________ ?  ?LABS ?(all labs ordered are listed, but only abnormal results are displayed) ? ?Labs Reviewed  ?GROUP A STREP BY PCR  ? ?____________________________________________ ? ?  EKG ? ?Not indiated ?____________________________________________ ? ?RADIOLOGY ? ?Not indicated. ?____________________________________________ ? ? ?PROCEDURES ? ?Procedure(s) performed: None ? ?Critical Care performed: No ?____________________________________________ ? ? ?INITIAL IMPRESSION / ASSESSMENT AND PLAN / ED COURSE ? ?26 year old female presenting to the emergency department for treatment and evaluation of sore throat.  See HPI  for details.  Plan will be to get a strep screen. ? ?Strep screen is negative.  Plan will be to have her take cetirizine daily and follow-up with her OB/GYN.  ? ?Pertinent labs & imaging results that were available during my care of the patient were reviewed by me and considered in my medical decision making (see chart for details). ?____________________________________________ ? ?New Prescriptions  ? No medications on file  ? ? ?FINAL CLINICAL IMPRESSION(S) / ED DIAGNOSES ? ?Final diagnoses:  ?Acute pharyngitis, unspecified etiology  ? ? ?If controlled substance prescribed during this visit, 12 month history viewed on the [facility] prior to issuing an initial prescription for Schedule II or III opiod. ? ? ?Note:  This document was prepared using Dragon voice recognition software and may include unintentional dictation errors. ? ?  ?Victorino Dike, FNP ?08/13/21 1507 ? ?  ?Lavonia Drafts, MD ?08/13/21 1528 ? ?

## 2021-08-13 NOTE — ED Triage Notes (Signed)
Pt c/o sore throat since Thursday.  

## 2021-08-15 ENCOUNTER — Encounter (HOSPITAL_COMMUNITY): Payer: Self-pay | Admitting: Obstetrics & Gynecology

## 2021-08-15 ENCOUNTER — Inpatient Hospital Stay (HOSPITAL_COMMUNITY)
Admission: AD | Admit: 2021-08-15 | Discharge: 2021-08-15 | Disposition: A | Discharge status: Home / Self Care | Payer: Medicaid Other | Attending: Obstetrics & Gynecology | Admitting: Obstetrics & Gynecology

## 2021-08-15 ENCOUNTER — Other Ambulatory Visit: Payer: Self-pay

## 2021-08-15 DIAGNOSIS — Z3A33 33 weeks gestation of pregnancy: Secondary | ICD-10-CM | POA: Insufficient documentation

## 2021-08-15 DIAGNOSIS — O99513 Diseases of the respiratory system complicating pregnancy, third trimester: Secondary | ICD-10-CM | POA: Insufficient documentation

## 2021-08-15 DIAGNOSIS — J029 Acute pharyngitis, unspecified: Secondary | ICD-10-CM

## 2021-08-15 DIAGNOSIS — R051 Acute cough: Secondary | ICD-10-CM | POA: Diagnosis not present

## 2021-08-15 MED ORDER — HYDROCOD POLI-CHLORPHE POLI ER 10-8 MG/5ML PO SUER: 5.0000 mL | Freq: Every evening | ORAL | 0 refills | Status: DC | PRN

## 2021-08-15 MED ORDER — LIDOCAINE VISCOUS HCL 2 % MT SOLN: 5.0000 mL | Freq: Four times a day (QID) | OROMUCOSAL | 0 refills | Status: DC | PRN

## 2021-08-15 MED ORDER — BENZONATATE 100 MG PO CAPS: 100.0000 mg | ORAL_CAPSULE | Freq: Three times a day (TID) | ORAL | 0 refills | Status: DC

## 2021-08-15 NOTE — MAU Provider Note (Signed)
?History  ?  ? ?CSN: [id number] ? ?Arrival date and time: 08/15/21 2257 ? ? Event Date/Time  ? First Provider Initiated Contact with Patient 08/15/21 2319   ?  ? ?Chief Complaint  ?Patient presents with  ? Sore Throat  ? Cough  ? ?HPI ?Ebony [name] is a 26 y.o. [phone] at [redacted]w[redacted]d who presents with a sore throat and a dry cough. This is her third presentation for evaluation for the same complaints. She was seen on 4/5 in the [facility] triage and tested negative for respiratory pathogens. She was given IV fluids and discharged home. She was seen again on 4/10 at [facility] Emergency department and had a negative strep test and given cetirizine to start taking. She has not started that medication. She is in MAU with the same complaints and states that she cannot rest at night because of the cough.  ? ?She denies any pregnancy complaints. She denies any abdominal pain, vaginal bleeding or discharge. She reports normal fetal movement. She gets prenatal care at [facility].  ? ?OB History   ? ? Gravida  ?4  ? Para  ?2  ? Term  ?2  ? Preterm  ?   ? AB  ?1  ? Living  ?2  ?  ? ? SAB  ?   ? IAB  ?1  ? Ectopic  ?   ? Multiple  ?0  ? Live Births  ?2  ?   ?  ?  ? ? ?Past Medical History:  ?Diagnosis Date  ? Acne   ? Eczema, allergic   ? History of tonsillectomy   ? Medical history non-contributory   ? ? ?Past Surgical History:  ?Procedure Laterality Date  ? ADENOIDECTOMY    ? DILATION AND EVACUATION N/A 05/29/2017  ? Procedure: DILATATION AND EVACUATION;  Surgeon: Conard Novak, MD;  Location: [facility] ORS;  Service: Gynecology;  Laterality: N/A;  ? TONSILLECTOMY    ? WISDOM TOOTH EXTRACTION    ? ? ?Family History  ?Problem Relation Age of Onset  ? Diabetes Brother   ? ? ?Social History  ? ?Tobacco Use  ? Smoking status: Never  ? Smokeless tobacco: Never  ?Vaping Use  ? Vaping Use: Never used  ?Substance Use Topics  ? Alcohol use: Yes  ?  Alcohol/week: 0.0 standard drinks  ?  Comment: occ  ? Drug use: Not Currently  ?  Types: Marijuana   ?  Comment: last used 2019   ? ? ?Allergies: No Known Allergies ? ?Medications Prior to Admission  ?Medication Sig Dispense Refill Last Dose  ? Prenatal Vit-Fe Fumarate-FA (PRENATAL MULTIVITAMIN) TABS tablet Take 1 tablet by mouth daily at 12 noon.   08/15/2021  ? phenazopyridine (PYRIDIUM) 200 MG tablet Take 1 tablet (200 mg total) by mouth 3 (three) times daily as needed for pain. (Patient not taking: Reported on 10/05/2020) 6 tablet 0 Unknown  ? ? ?Review of Systems  ?Constitutional: Negative.  Negative for fatigue and fever.  ?HENT:  Positive for sore throat.   ?Respiratory:  Positive for cough. Negative for shortness of breath.   ?Cardiovascular: Negative.  Negative for chest pain.  ?Gastrointestinal: Negative.  Negative for abdominal pain, constipation, diarrhea, nausea and vomiting.  ?Genitourinary: Negative.  Negative for dysuria, vaginal bleeding and vaginal discharge.  ?Neurological: Negative.  Negative for dizziness and headaches.  ?Physical Exam  ? ?Blood pressure 127/77, pulse (!) 115, temperature 98.9 ?F (37.2 ?C), temperature source Oral, resp. rate 18, height 5\' 5"  (  1.651 m), weight 100.4 kg, last menstrual period 12/30/2020, SpO2 99 %, unknown if currently breastfeeding. ? ?Physical Exam ?Vitals and nursing note reviewed.  ?Constitutional:   ?   General: She is not in acute distress. ?   Appearance: She is well-developed.  ?HENT:  ?   Head: Normocephalic.  ?Eyes:  ?   Pupils: Pupils are equal, round, and reactive to light.  ?Cardiovascular:  ?   Rate and Rhythm: Normal rate and regular rhythm.  ?   Heart sounds: Normal heart sounds.  ?Pulmonary:  ?   Effort: Pulmonary effort is normal. No respiratory distress.  ?   Breath sounds: Normal breath sounds.  ?Abdominal:  ?   General: Bowel sounds are normal. There is no distension.  ?   Palpations: Abdomen is soft.  ?   Tenderness: There is no abdominal tenderness.  ?Skin: ?   General: Skin is warm and dry.  ?Neurological:  ?   Mental Status: She is alert  and oriented to person, place, and time.  ?Psychiatric:     ?   Mood and Affect: Mood normal.     ?   Behavior: Behavior normal.     ?   Thought Content: Thought content normal.     ?   Judgment: Judgment normal.  ? ?FHT: 161 ? ?MAU Course  ?Procedures ? ?MDM ?Options for management reviewed at length. Reassurance provided that she does not need to be retested for respiratory pathogens or strep. Discussed home management at length including prescription options and OTC ? ?Assessment and Plan  ? ?1. Sore throat   ?2. Acute cough   ?3. [redacted] weeks gestation of pregnancy   ? ?-Discharge home in stable condition ?-Rx for tussinex, tessalon perles and magic mouthwash sent to patient's pharmacy ?-URI precautions discussed ?-Patient advised to follow-up with OB as scheduled for prenatal care ?-Patient may return to MAU as needed or if her condition were to change or worsen ? ? ?Rolm Bookbinder CNM ?08/15/2021, 11:19 PM  ?

## 2021-08-15 NOTE — Discharge Instructions (Signed)

## 2021-08-15 NOTE — MAU Note (Signed)
.  Ebony [name] is a 26 y.o. at [redacted]w[redacted]d here in MAU reporting: sore throat 7/10 and dry cough that started on Thursday. Denies any other symptoms. She did take tylenol and did not get any relief. Reports that she did go to urgent care and they told her that is was allergies. Denies VB, LOF. Reports good FM.  ? ?Onset of complaint: Thursday 08/09/21 ?Pain score: 7/10 ?Vitals:  ? 08/15/21 2309  ?BP: 127/77  ?Pulse: (!) 115  ?Resp: 18  ?Temp: 98.9 ?F (37.2 ?C)  ?SpO2: 99%  ?   ?FHT:161 bpm ?Lab orders placed from triage:   ? ?

## 2021-12-16 ENCOUNTER — Encounter: Payer: Self-pay | Admitting: Emergency Medicine

## 2021-12-16 ENCOUNTER — Emergency Department
Admission: EM | Admit: 2021-12-16 | Discharge: 2021-12-16 | Disposition: A | Discharge status: Home / Self Care | Payer: BC Managed Care – PPO | Attending: Student in an Organized Health Care Education/Training Program | Admitting: Student in an Organized Health Care Education/Training Program

## 2021-12-16 DIAGNOSIS — M79602 Pain in left arm: Secondary | ICD-10-CM

## 2021-12-16 DIAGNOSIS — S40822A Blister (nonthermal) of left upper arm, initial encounter: Secondary | ICD-10-CM | POA: Insufficient documentation

## 2021-12-16 DIAGNOSIS — X58XXXA Exposure to other specified factors, initial encounter: Secondary | ICD-10-CM | POA: Insufficient documentation

## 2021-12-16 DIAGNOSIS — S4992XA Unspecified injury of left shoulder and upper arm, initial encounter: Secondary | ICD-10-CM | POA: Diagnosis present

## 2021-12-16 DIAGNOSIS — T148XXA Other injury of unspecified body region, initial encounter: Secondary | ICD-10-CM

## 2021-12-16 NOTE — Discharge Instructions (Addendum)
Today in the emergency room for left arm pain/blister.  The blister was drained of fluid.  You are encouraged to keep the area clean and dry and apply Neosporin daily for the next 5 days.  If you notice any signs of infection please report back to [facility] urgent care.

## 2021-12-16 NOTE — ED Provider Notes (Signed)
[facility] Emergency Department Provider Note   ____________________________________________   Event Date/Time   First MD Initiated Contact with Patient 12/16/21 1945     (approximate)  I have reviewed the triage vital signs and the nursing notes.   HISTORY  Chief Complaint Arm Pain    HPI Ebony [name] is a 26 y.o. female presents to the emergency room today for pain to left upper arm. Patient reports that last week she had her Nexplanon removed from left upper arm.  Then today she noticed that she was having some pain at the site of removal. On exam patient is noted to have small blister to the side of where the Nexplanon was removed. The actual site of Nexplanon removal is scabbed over/healed well.  There is no signs of redness/swelling/drainage/signs of infection. Aside from the small blister there is no redness of the upper arm. Patient reports that the only area causing pain in the left upper arm is the actual site of the blister. She describes the pain as a 2 out of 10 and has taken nothing for the pain at this time.   Past Medical History:  Diagnosis Date   Acne    Eczema, allergic    History of tonsillectomy    Medical history non-contributory     Patient Active Problem List   Diagnosis Date Noted   Pelvic pressure in pregnancy, antepartum, second trimester 05/28/2021   Decreased fetal movement 12/20/2017   Blighted ovum 05/27/2017   Obesity, pediatric, BMI 95th to 98th percentile for age 29/15/2016   Intermenstrual bleeding 12/19/2014   Allergic contact dermatitis 12/17/2014    Past Surgical History:  Procedure Laterality Date   ADENOIDECTOMY     DILATION AND EVACUATION N/A 05/29/2017   Procedure: DILATATION AND EVACUATION;  Surgeon: Conard Novak, MD;  Location: [facility] ORS;  Service: Gynecology;  Laterality: N/A;   TONSILLECTOMY     WISDOM TOOTH EXTRACTION      Prior to Admission medications   Medication Sig Start  Date End Date Taking? Authorizing Provider  benzonatate (TESSALON) 100 MG capsule Take 1 capsule (100 mg total) by mouth every 8 (eight) hours. 08/15/21   Rolm Bookbinder, CNM  chlorpheniramine-HYDROcodone [facility] PENNKINETIC ER) 10-8 MG/5ML Take 5 mLs by mouth at bedtime as needed for cough. 08/15/21   Rolm Bookbinder, CNM  magic mouthwash (lidocaine, diphenhydrAMINE, alum & mag hydroxide) suspension Swish and swallow 5 mLs 4 (four) times daily as needed for mouth pain. 08/15/21   Rolm Bookbinder, CNM  phenazopyridine (PYRIDIUM) 200 MG tablet Take 1 tablet (200 mg total) by mouth 3 (three) times daily as needed for pain. Patient not taking: Reported on 10/05/2020 09/20/20   Joni Reining, PA-C  Prenatal Vit-Fe Fumarate-FA (PRENATAL MULTIVITAMIN) TABS tablet Take 1 tablet by mouth daily at 12 noon.    [provider]  sodium chloride (OCEAN) 0.65 % SOLN nasal spray Place 1 spray into both nostrils as needed for congestion. 02/09/18 06/05/20  Enid Derry, PA-C    Allergies Patient has no known allergies.  Family History  Problem Relation Age of Onset   Diabetes Brother     Social History Social History   Tobacco Use   Smoking status: Never   Smokeless tobacco: Never  Vaping Use   Vaping Use: Never used  Substance Use Topics   Alcohol use: Yes    Alcohol/week: 0.0 standard drinks of alcohol    Comment: occ   Drug use: Not  Currently    Types: Marijuana    Comment: last used 2019     Review of Systems  Constitutional: No fever/chills Eyes: No visual changes. ENT: No sore throat. Cardiovascular: Denies chest pain. Respiratory: Denies shortness of breath. Gastrointestinal: No abdominal pain.  No nausea, no vomiting.  No diarrhea.  No constipation. Genitourinary: Negative for dysuria. Musculoskeletal: Negative for back pain.  Positive for left arm pain. Skin: Negative for rash. Neurological: Negative for headaches, focal weakness or  numbness.   ____________________________________________   PHYSICAL EXAM:  VITAL SIGNS: ED Triage Vitals  Enc Vitals Group     BP 12/16/21 1925 122/79     Pulse Rate 12/16/21 1925 89     Resp 12/16/21 1925 18     Temp 12/16/21 1925 98.5 F (36.9 C)     Temp Source 12/16/21 1925 Oral     SpO2 12/16/21 1925 100 %     Weight 12/16/21 1923 210 lb (95.3 kg)     Height 12/16/21 1923 5\' 6"  (1.676 m)     Head Circumference --      Peak Flow --      Pain Score 12/16/21 1923 3     Pain Loc --      Pain Edu? --      Excl. in [place]? --     Constitutional: Alert and oriented. Well appearing and in no acute distress. Eyes: Conjunctivae are normal. PERRL. EOMI. Head: Atraumatic. Nose: No congestion/rhinnorhea. Mouth/Throat: Mucous membranes are moist.  Oropharynx non-erythematous. Neck: No stridor.   Cardiovascular: Normal rate, regular rhythm. Grossly normal heart sounds.  Good peripheral circulation. Respiratory: Normal respiratory effort.  No retractions. Lungs CTAB. Gastrointestinal: Soft and nontender. No distention. No abdominal bruits. No CVA tenderness. Musculoskeletal: Patient is noted to have small blister to left upper arm directly beside where Nexplanon was removed.  There is no redness/swelling/drainage/signs of infection to the Nexplanon removal site.  There is no redness/signs of infection around the small blister.  The blister is approximately 5 mm. Neurologic:  Normal speech and language. No gross focal neurologic deficits are appreciated. No gait instability. Skin:  Skin is warm, dry and intact. No rash noted. Psychiatric: Mood and affect are normal. Speech and behavior are normal.  ____________________________________________   LABS (all labs ordered are listed, but only abnormal results are displayed)  Labs Reviewed - No data to display ____________________________________________  EKG   ____________________________________________  RADIOLOGY  ED MD  interpretation:    Official radiology report(s): No results found.  ____________________________________________   PROCEDURES  Procedure(s) performed: None  Procedures  Critical Care performed: No  ____________________________________________   INITIAL IMPRESSION / ASSESSMENT AND PLAN / ED COURSE     Ebony [name] is a 26 y.o. female presents to the emergency room today for pain to left upper arm. Patient reports that last week she had her Nexplanon removed from left upper arm.  Then today she noticed that she was having some pain at the site of removal. On exam patient is noted to have small blister to the side of where the Nexplanon was removed. The actual site of Nexplanon removal is scabbed over/healed well.  There is no signs of redness/swelling/drainage/signs of infection. Aside from the small blister there is no redness of the upper arm. Patient reports that the only area causing pain in the left upper arm is the actual site of the blister. She describes the pain as a 2 out of 10 and has taken nothing for the  pain at this time.   Today I took a 23-gauge 1 inch needle and inserted into the blister/drawing back fluid.  I then took a gauze and express the remainder of the fluid out of the blister. Patient reports that the pain has now subsided. Patient is instructed to keep area clean and dry and apply Neosporin daily for the next 5 days. Discharge patient home in stable condition at this time.      ____________________________________________   FINAL CLINICAL IMPRESSION(S) / ED DIAGNOSES  Final diagnoses:  Blister  Left arm pain     ED Discharge Orders     None        Note:  This document was prepared using Dragon voice recognition software and may include unintentional dictation errors.     Herschell Dimes, NP 12/16/21 2012    Willy Eddy, MD 12/16/21 2014

## 2021-12-16 NOTE — ED Triage Notes (Signed)
Pt presents via POV with complaints of left arm soreness after having her nexplanon removed last Thursday. Pt has a small blister on the left arm that developed. Denies fevers, chills, drainage, CP or SOB.

## 2022-02-06 ENCOUNTER — Encounter: Payer: Self-pay | Admitting: Emergency Medicine

## 2022-02-06 ENCOUNTER — Other Ambulatory Visit: Payer: Self-pay

## 2022-02-06 ENCOUNTER — Emergency Department
Admission: EM | Admit: 2022-02-06 | Discharge: 2022-02-06 | Disposition: A | Discharge status: Home / Self Care | Payer: BC Managed Care – PPO | Attending: Student in an Organized Health Care Education/Training Program | Admitting: Student in an Organized Health Care Education/Training Program

## 2022-02-06 DIAGNOSIS — J028 Acute pharyngitis due to other specified organisms: Secondary | ICD-10-CM | POA: Diagnosis not present

## 2022-02-06 DIAGNOSIS — Z20822 Contact with and (suspected) exposure to covid-19: Secondary | ICD-10-CM | POA: Diagnosis not present

## 2022-02-06 DIAGNOSIS — R519 Headache, unspecified: Secondary | ICD-10-CM | POA: Diagnosis present

## 2022-02-06 DIAGNOSIS — B9789 Other viral agents as the cause of diseases classified elsewhere: Secondary | ICD-10-CM | POA: Insufficient documentation

## 2022-02-06 DIAGNOSIS — J029 Acute pharyngitis, unspecified: Secondary | ICD-10-CM

## 2022-02-06 LAB — RESP PANEL BY RT-PCR (FLU A&B, COVID) ARPGX2
Influenza A by PCR: NEGATIVE
Influenza B by PCR: NEGATIVE
SARS Coronavirus 2 by RT PCR: NEGATIVE

## 2022-02-06 LAB — GROUP A STREP BY PCR: Group A Strep by PCR: NOT DETECTED

## 2022-02-06 MED ORDER — IBUPROFEN 800 MG PO TABS
800.0000 mg | ORAL_TABLET | Freq: Once | ORAL | Status: AC
  Administered 2022-02-06: 800 mg via ORAL
  Filled 2022-02-06: qty 1

## 2022-02-06 NOTE — ED Triage Notes (Signed)
Pt via POV from home. Pt c/o sore throat and migraine for the past 2 days. Denies cough/congestion. Pt has a hx of migraine. Denies NV. Pt is A&OX4 and NAD

## 2022-02-06 NOTE — ED Provider Notes (Signed)
[facility] EMERGENCY DEPARTMENT Provider Note   CSN: [id number] Arrival date & time: 02/06/22  1707     History  Chief Complaint  Patient presents with   Migraine   Sore Throat    Ebony [name] is a 26 y.o. female.  With no past medical history presents to the emergency department for evaluation of headache, sore throat.  Symptoms been present for 2 days.  She denies any cough congestion runny nose or fevers.  No abdominal pain chest pain shortness of breath or vomiting or diarrhea.  She has not been take any medications for symptoms.  Headache is moderate, achy no photophobia, dizziness, lightheadedness.  No trauma or injury.  No rashes  HPI     Home Medications Prior to Admission medications   Medication Sig Start Date End Date Taking? Authorizing Provider  benzonatate (TESSALON) 100 MG capsule Take 1 capsule (100 mg total) by mouth every 8 (eight) hours. 08/15/21   Wende Mott, CNM  chlorpheniramine-HYDROcodone [facility] PENNKINETIC ER) 10-8 MG/5ML Take 5 mLs by mouth at bedtime as needed for cough. 08/15/21   Wende Mott, CNM  magic mouthwash (lidocaine, diphenhydrAMINE, alum & mag hydroxide) suspension Swish and swallow 5 mLs 4 (four) times daily as needed for mouth pain. 08/15/21   Wende Mott, CNM  phenazopyridine (PYRIDIUM) 200 MG tablet Take 1 tablet (200 mg total) by mouth 3 (three) times daily as needed for pain. Patient not taking: Reported on 10/05/2020 09/20/20   Sable Feil, PA-C  Prenatal Vit-Fe Fumarate-FA (PRENATAL MULTIVITAMIN) TABS tablet Take 1 tablet by mouth daily at 12 noon.    [provider]  sodium chloride (OCEAN) 0.65 % SOLN nasal spray Place 1 spray into both nostrils as needed for congestion. 02/09/18 06/05/20  Laban Emperor, PA-C      Allergies    Patient has no known allergies.    Review of Systems   Review of Systems  Physical Exam Updated Vital Signs BP 112/76 (BP Location: Right Arm)    Pulse 91   Temp 98 F (36.7 C) (Oral)   Resp 16   Ht 5\' 5"  (1.651 m)   Wt 97.1 kg   LMP 01/20/2022   SpO2 94%   BMI 35.61 kg/m  Physical Exam Constitutional:      Appearance: She is well-developed.  HENT:     Head: Normocephalic and atraumatic.     Right Ear: Tympanic membrane, ear canal and external ear normal. There is no impacted cerumen.     Left Ear: Tympanic membrane, ear canal and external ear normal. There is no impacted cerumen. Tympanic membrane is not erythematous.     Nose: Nose normal. No congestion or rhinorrhea.     Mouth/Throat:     Mouth: Mucous membranes are moist.     Pharynx: Posterior oropharyngeal erythema present. No oropharyngeal exudate.  Eyes:     Conjunctiva/sclera: Conjunctivae normal.  Cardiovascular:     Rate and Rhythm: Normal rate.  Pulmonary:     Effort: Pulmonary effort is normal. No respiratory distress.  Abdominal:     General: Abdomen is flat. There is no distension.     Tenderness: There is no abdominal tenderness. There is no guarding.  Musculoskeletal:        General: Normal range of motion.     Cervical back: Normal range of motion.  Skin:    General: Skin is warm.     Findings: No rash.  Neurological:  General: No focal deficit present.     Mental Status: She is alert and oriented to person, place, and time.  Psychiatric:        Mood and Affect: Mood normal.        Behavior: Behavior normal.        Thought Content: Thought content normal.     ED Results / Procedures / Treatments   Labs (all labs ordered are listed, but only abnormal results are displayed) Labs Reviewed  GROUP A STREP BY PCR  RESP PANEL BY RT-PCR (FLU A&B, COVID) [id number]    EKG None  Radiology No results found.  Procedures Procedures    Medications Ordered in ED Medications  ibuprofen (ADVIL) tablet 800 mg (800 mg Oral Given 02/06/22 1801)    ED Course/ Medical Decision Making/ A&P                           Medical Decision  Making Risk Prescription drug management.   26 year old female with sore throat and headache.  Symptoms began 2 days ago.  Tonsils have been removed.  Minimal erythema of the pharynx.  No exudates noted.  Strep test negative.  Flu COVID test are negative.  Patient afebrile.  Lungs are clear to auscultation.  Vital signs are stable.  Patient likely with mild viral illness.  She will alternate Tylenol and ibuprofen and continue to monitor symptoms.  She understands signs symptoms return to the ER for.  Final Clinical Impression(s) / ED Diagnoses Final diagnoses:  Viral pharyngitis    Rx / DC Orders ED Discharge Orders     None         Ronnette Juniper 02/06/22 1906    Willy Eddy, MD 02/06/22 [phone]

## 2022-02-06 NOTE — Discharge Instructions (Signed)
Please alternate Tylenol and ibuprofen as needed.  Make sure you are drinking lots of fluids.  If any fevers, difficulty swallowing, shortness of breath, severe cough return to the emergency department.

## 2022-03-04 ENCOUNTER — Other Ambulatory Visit: Payer: Self-pay

## 2022-03-04 ENCOUNTER — Emergency Department
Admission: EM | Admit: 2022-03-04 | Discharge: 2022-03-04 | Disposition: A | Discharge status: Home / Self Care | Payer: BC Managed Care – PPO | Attending: Emergency Medicine | Admitting: Emergency Medicine

## 2022-03-04 DIAGNOSIS — J069 Acute upper respiratory infection, unspecified: Secondary | ICD-10-CM

## 2022-03-04 DIAGNOSIS — M791 Myalgia, unspecified site: Secondary | ICD-10-CM | POA: Diagnosis not present

## 2022-03-04 DIAGNOSIS — Z20822 Contact with and (suspected) exposure to covid-19: Secondary | ICD-10-CM | POA: Diagnosis not present

## 2022-03-04 DIAGNOSIS — R059 Cough, unspecified: Secondary | ICD-10-CM | POA: Diagnosis present

## 2022-03-04 LAB — SARS CORONAVIRUS 2 BY RT PCR: SARS Coronavirus 2 by RT PCR: NEGATIVE

## 2022-03-04 MED ORDER — BENZONATATE 100 MG PO CAPS: 100.0000 mg | ORAL_CAPSULE | Freq: Three times a day (TID) | ORAL | 0 refills | Status: DC | PRN

## 2022-03-04 MED ORDER — FLUTICASONE PROPIONATE 50 MCG/ACT NA SUSP: 1.0000 | Freq: Every day | NASAL | 0 refills | Status: DC

## 2022-03-04 MED ORDER — MELOXICAM 15 MG PO TABS: 15.0000 mg | ORAL_TABLET | Freq: Every day | ORAL | 0 refills | Status: AC

## 2022-03-04 NOTE — ED Triage Notes (Signed)
Pt c/o congestion, cough, body aches started overnight. Denies fever, daughter sick with same but tested negative.

## 2022-03-04 NOTE — ED Provider Notes (Signed)
[facility] Provider Note    Event Date/Time   First MD Initiated Contact with Patient 03/04/22 1008     (approximate)   History   Chief Complaint Nasal Congestion and Generalized Body Aches   HPI Ebony [name] is a 26 y.o. female, no significant medical history, presents to the emergency department for evaluation of cough, congestion, body aches.  She states that other members under family have also been sick with similar symptoms.  She states that she woke up with the symptoms this morning.  Denies chest pain, shortness of breath, abdominal pain, flank pain, nausea/vomiting, diarrhea, dysuria, headache, dizziness/lightheadedness, or rash/lesions.   History Limitations: No limitations.        Physical Exam  Triage Vital Signs: ED Triage Vitals  Enc Vitals Group     BP 03/04/22 0953 105/87     Pulse Rate 03/04/22 0953 76     Resp 03/04/22 0953 16     Temp 03/04/22 0953 98.3 F (36.8 C)     Temp Source 03/04/22 0953 Oral     SpO2 03/04/22 0953 95 %     Weight 03/04/22 0950 210 lb (95.3 kg)     Height 03/04/22 0950 5\' 6"  (1.676 m)     Head Circumference --      Peak Flow --      Pain Score 03/04/22 0950 0     Pain Loc --      Pain Edu? --      Excl. in [employer]? --     Most recent vital signs: Vitals:   03/04/22 0953  BP: 105/87  Pulse: 76  Resp: 16  Temp: 98.3 F (36.8 C)  SpO2: 95%    General: Awake, NAD.  Skin: Warm, dry. No rashes or lesions.  Eyes: PERRL. Conjunctivae normal.  CV: Good peripheral perfusion.  Resp: Normal effort.  Lung sounds are clear bilaterally. Abd: Soft, non-tender. No distention.  Neuro: At baseline. No gross neurological deficits.  Musculoskeletal: Normal ROM of all extremities.  Focused Exam: Throat exam unremarkable.  No tonsillar swelling or exudates.  Uvula midline.  Physical Exam    ED Results / Procedures / Treatments  Labs (all labs ordered are listed, but only abnormal results are  displayed) Labs Reviewed  SARS CORONAVIRUS 2 BY RT PCR     EKG N/A.    RADIOLOGY  ED Provider Interpretation: N/A.  No results found.  PROCEDURES:  Critical Care performed: N/A.  Procedures    MEDICATIONS ORDERED IN ED: Medications - No data to display   IMPRESSION / MDM / [place] / ED COURSE  I reviewed the triage vital signs and the nursing notes.                              Differential diagnosis includes, but is not limited to, viral URI, allergic rhinitis, commune acquired pneumonia, bronchitis, sinusitis, COVID-19   Assessment/Plan Presentation consistent with viral URI.  Other family members have also been experiencing similar symptoms, though they have all tested negative for influenza, COVID-19, and RSV.  Pending COVID-19 test at this time, however unlikely to change management.  She is otherwise healthy with no significant comorbidities.  No pulmonary involvement at this point.  I do not believe that chest x-ray or lab studies are indicated at this time.  We will provide her with prescription for meloxicam, Flonase, and benzonatate to help manage her symptoms.  Will discharge.  Provided the patient with anticipatory guidance, return precautions, and educational material. Encouraged the patient to return to the emergency department at any time if they begin to experience any new or worsening symptoms. Patient expressed understanding and agreed with the plan.   Patient's presentation is most consistent with acute complicated illness / injury requiring diagnostic workup.       FINAL CLINICAL IMPRESSION(S) / ED DIAGNOSES   Final diagnoses:  Viral upper respiratory tract infection     Rx / DC Orders   ED Discharge Orders          Ordered    benzonatate (TESSALON PERLES) 100 MG capsule  3 times daily PRN        03/04/22 1105    fluticasone (FLONASE) 50 MCG/ACT nasal spray  Daily        03/04/22 1105    meloxicam (MOBIC) 15 MG tablet   Daily        03/04/22 1105             Note:  This document was prepared using Dragon voice recognition software and may include unintentional dictation errors.   Teodoro Spray, [place] 03/04/22 1109    Harvest Dark, MD 03/04/22 [phone]

## 2022-03-04 NOTE — Discharge Instructions (Addendum)
-  You may take the meloxicam as needed for body aches.  In addition take acetaminophen on top of this.  -You may take the Flonase at needed for sinus congestion.  -Take the benzonatate as needed for cough.  -Return to the emergency department anytime if you begin to experience any new or worsening symptoms.

## 2022-03-12 ENCOUNTER — Encounter: Payer: Self-pay | Admitting: Emergency Medicine

## 2022-03-12 ENCOUNTER — Other Ambulatory Visit: Payer: Self-pay

## 2022-03-12 DIAGNOSIS — X500XXA Overexertion from strenuous movement or load, initial encounter: Secondary | ICD-10-CM | POA: Diagnosis not present

## 2022-03-12 DIAGNOSIS — Y990 Civilian activity done for income or pay: Secondary | ICD-10-CM | POA: Diagnosis not present

## 2022-03-12 DIAGNOSIS — S3992XA Unspecified injury of lower back, initial encounter: Secondary | ICD-10-CM | POA: Diagnosis present

## 2022-03-12 DIAGNOSIS — S39012A Strain of muscle, fascia and tendon of lower back, initial encounter: Secondary | ICD-10-CM | POA: Insufficient documentation

## 2022-03-12 NOTE — ED Triage Notes (Signed)
Pt to ED from home c/o lower back pain since Sunday.  Denies urinary changes, n/v/d, or injury.  Pt A&Ox4, chest rise even and unlabored, skin WNL and in NAD at this time.

## 2022-03-13 ENCOUNTER — Emergency Department
Admission: EM | Admit: 2022-03-13 | Discharge: 2022-03-13 | Disposition: A | Discharge status: Home / Self Care | Payer: BC Managed Care – PPO | Attending: Emergency Medicine | Admitting: Emergency Medicine

## 2022-03-13 DIAGNOSIS — S39012A Strain of muscle, fascia and tendon of lower back, initial encounter: Secondary | ICD-10-CM

## 2022-03-13 DIAGNOSIS — M545 Low back pain, unspecified: Secondary | ICD-10-CM

## 2022-03-13 LAB — URINALYSIS, ROUTINE W REFLEX MICROSCOPIC
Bacteria, UA: NONE SEEN
Bilirubin Urine: NEGATIVE
Glucose, UA: NEGATIVE mg/dL
Ketones, ur: NEGATIVE mg/dL
Nitrite: NEGATIVE
Protein, ur: 30 mg/dL — AB
Specific Gravity, Urine: 1.026 (ref 1.005–1.030)
pH: 5 (ref 5.0–8.0)

## 2022-03-13 LAB — POC URINE PREG, ED: Preg Test, Ur: NEGATIVE

## 2022-03-13 MED ORDER — LIDOCAINE 5 % EX PTCH: 1.0000 | MEDICATED_PATCH | Freq: Two times a day (BID) | CUTANEOUS | 0 refills | Status: DC

## 2022-03-13 MED ORDER — LIDOCAINE 5 % EX PTCH
1.0000 | MEDICATED_PATCH | CUTANEOUS | Status: DC
  Administered 2022-03-13: 1 via TRANSDERMAL
  Filled 2022-03-13: qty 1

## 2022-03-13 MED ORDER — KETOROLAC TROMETHAMINE 30 MG/ML IJ SOLN
30.0000 mg | Freq: Once | INTRAMUSCULAR | Status: AC
  Administered 2022-03-13: 30 mg via INTRAMUSCULAR
  Filled 2022-03-13: qty 1

## 2022-03-13 MED ORDER — CYCLOBENZAPRINE HCL 5 MG PO TABS: 5.0000 mg | ORAL_TABLET | Freq: Three times a day (TID) | ORAL | 0 refills | Status: DC | PRN

## 2022-03-13 NOTE — ED Provider Notes (Signed)
[facility] Provider Note    Event Date/Time   First MD Initiated Contact with Patient 03/13/22 0106     (approximate)   History   Chief Complaint Back Pain   HPI  Ebony [name] is a 26 y.o. female with no significant past medical history presents to the ED complaining of back pain.  Patient reports that she has had about 3 days of persistent pain in both sides of her lower back.  She describes it as an aching discomfort that is worse with certain positions.  She believes it came on after lifting heavy cases of water at work, states pain seems to be worse when she is bending and lifting.  She denies any numbness or weakness in her extremities, has not had any numbness in her groin or urinary retention/incontinence.  She has taken Tylenol and ibuprofen at home without significant relief.     Physical Exam   Triage Vital Signs: ED Triage Vitals  Enc Vitals Group     BP 03/12/22 2222 119/78     Pulse Rate 03/12/22 2222 73     Resp 03/12/22 2222 18     Temp 03/12/22 2222 98.9 F (37.2 C)     Temp Source 03/12/22 2222 Oral     SpO2 03/12/22 2222 96 %     Weight 03/12/22 2222 210 lb (95.3 kg)     Height 03/12/22 2222 5\' 6"  (1.676 m)     Head Circumference --      Peak Flow --      Pain Score 03/12/22 2222 6     Pain Loc --      Pain Edu? --      Excl. in [place]? --     Most recent vital signs: Vitals:   03/12/22 2222  BP: 119/78  Pulse: 73  Resp: 18  Temp: 98.9 F (37.2 C)  SpO2: 96%    Constitutional: Alert and oriented. Eyes: Conjunctivae are normal. Head: Atraumatic. Nose: No congestion/rhinnorhea. Mouth/Throat: Mucous membranes are moist.  Cardiovascular: Normal rate, regular rhythm. Grossly normal heart sounds.  2+ radial and DP pulses bilaterally. Respiratory: Normal respiratory effort.  No retractions. Lungs CTAB. Gastrointestinal: Soft and nontender. No distention. Musculoskeletal: No lower extremity tenderness nor edema.   Bilateral lumbar paraspinal tenderness to palpation, no midline tenderness to palpation. Neurologic:  Normal speech and language. No gross focal neurologic deficits are appreciated.    ED Results / Procedures / Treatments   Labs (all labs ordered are listed, but only abnormal results are displayed) Labs Reviewed  URINALYSIS, ROUTINE W REFLEX MICROSCOPIC - Abnormal; Notable for the following components:      Result Value   Color, Urine YELLOW (*)    APPearance CLOUDY (*)    Hgb urine dipstick SMALL (*)    Protein, ur 30 (*)    Leukocytes,Ua TRACE (*)    All other components within normal limits  POC URINE PREG, ED    PROCEDURES:  Critical Care performed: No  Procedures   MEDICATIONS ORDERED IN ED: Medications  ketorolac (TORADOL) 30 MG/ML injection 30 mg (has no administration in time range)  lidocaine (LIDODERM) 5 % 1 patch (has no administration in time range)     IMPRESSION / MDM / ASSESSMENT AND PLAN / ED COURSE  I reviewed the triage vital signs and the nursing notes.  26 y.o. female with no significant past medical history who presents to the ED complaining of worsening pain in both sides of her lower back over the past 3 days, exacerbated when she goes to bend and lift.  Patient's presentation is most consistent with acute, uncomplicated illness.  Differential diagnosis includes, but is not limited to, lumbar strain, lumbar radiculopathy, cauda equina.  Patient well-appearing and in no acute distress, vital signs are unremarkable.  She is neurovascular intact to her bilateral lower extremities and symptoms seem consistent with lumbar strain.  No findings to suggest cauda equina and no significant traumatic injury to necessitate imaging.  We will treat symptomatically with IM Toradol and Lidoderm patch.  Pregnancy testing is negative, urinalysis appears to be contaminated but patient denies any urinary symptoms, will hold off on  antibiotics.  She was counseled to return to the ED for new or worsening symptoms, patient agrees with plan.      FINAL CLINICAL IMPRESSION(S) / ED DIAGNOSES   Final diagnoses:  Acute bilateral low back pain without sciatica  Strain of lumbar region, initial encounter     Rx / DC Orders   ED Discharge Orders          Ordered    cyclobenzaprine (FLEXERIL) 5 MG tablet  3 times daily PRN        03/13/22 0227    lidocaine (LIDODERM) 5 %  Every 12 hours        03/13/22 0227             Note:  This document was prepared using Dragon voice recognition software and may include unintentional dictation errors.   Chesley Noon, MD 03/13/22 [phone]

## 2022-04-23 ENCOUNTER — Other Ambulatory Visit: Payer: Self-pay

## 2022-04-23 ENCOUNTER — Emergency Department
Admission: EM | Admit: 2022-04-23 | Discharge: 2022-04-23 | Disposition: A | Discharge status: Home / Self Care | Payer: BC Managed Care – PPO | Attending: Student in an Organized Health Care Education/Training Program | Admitting: Student in an Organized Health Care Education/Training Program

## 2022-04-23 DIAGNOSIS — J069 Acute upper respiratory infection, unspecified: Secondary | ICD-10-CM | POA: Insufficient documentation

## 2022-04-23 DIAGNOSIS — R059 Cough, unspecified: Secondary | ICD-10-CM | POA: Diagnosis present

## 2022-04-23 DIAGNOSIS — Z1152 Encounter for screening for COVID-19: Secondary | ICD-10-CM | POA: Diagnosis not present

## 2022-04-23 LAB — RESP PANEL BY RT-PCR (RSV, FLU A&B, COVID)  RVPGX2
Influenza A by PCR: POSITIVE — AB
Influenza B by PCR: NEGATIVE
Resp Syncytial Virus by PCR: NEGATIVE
SARS Coronavirus 2 by RT PCR: NEGATIVE

## 2022-04-23 MED ORDER — CEPACOL REGULAR STRENGTH 3 MG MT LOZG
1.0000 | LOZENGE | OROMUCOSAL | 12 refills | Status: DC | PRN
Start: 2022-04-23 — End: 2023-01-03

## 2022-04-23 MED ORDER — ACETAMINOPHEN 325 MG PO TABS
650.0000 mg | ORAL_TABLET | Freq: Once | ORAL | Status: DC
Start: 2022-04-23 — End: 2022-04-23

## 2022-04-23 MED ORDER — MELOXICAM 15 MG PO TABS: 15.0000 mg | ORAL_TABLET | Freq: Every day | ORAL | 0 refills | Status: AC

## 2022-04-23 NOTE — ED Triage Notes (Signed)
Reports flu like symptoms x 3 days.  Reports headache congestion and fever.

## 2022-04-23 NOTE — ED Provider Notes (Signed)
[facility] Provider Note    Event Date/Time   First MD Initiated Contact with Patient 04/23/22 1245     (approximate)   History   Chief Complaint URI   HPI Ebony [name] is a 26 y.o. female, history of eczema, presents for evaluation of flulike symptoms.  Reports cough, headache, congestion, and fever x 3 days.  Denies chest pain, shortness of breath, abdominal pain, flank pain, nausea/vomiting, diarrhea, dizziness/lightheadedness, weakness, or rash/lesions.  History Limitations: No limitations        Physical Exam  Triage Vital Signs: ED Triage Vitals  Enc Vitals Group     BP 04/23/22 1227 114/81     Pulse Rate 04/23/22 1227 (!) 104     Resp 04/23/22 1227 18     Temp 04/23/22 1227 (!) 101.6 F (38.7 C)     Temp Source 04/23/22 1227 Oral     SpO2 04/23/22 1227 94 %     Weight 04/23/22 1227 210 lb (95.3 kg)     Height 04/23/22 1227 5\' 6"  (1.676 m)     Head Circumference --      Peak Flow --      Pain Score 04/23/22 1226 9     Pain Loc --      Pain Edu? --      Excl. in [place]? --     Most recent vital signs: Vitals:   04/23/22 1227  BP: 114/81  Pulse: (!) 104  Resp: 18  Temp: (!) 101.6 F (38.7 C)  SpO2: 94%    General: Awake, NAD.  Audible cough and congestion in the room. Skin: Warm, dry. No rashes or lesions.  Eyes: PERRL. Conjunctivae normal.  CV: Good peripheral perfusion.  Resp: Normal effort.  Lung sounds are clear bilaterally throat exam unremarkable. Abd: Soft, non-tender. No distention.  Neuro: At baseline. No gross neurological deficits.  Musculoskeletal: Normal ROM of all extremities.  Physical Exam    ED Results / Procedures / Treatments  Labs (all labs ordered are listed, but only abnormal results are displayed) Labs Reviewed  RESP PANEL BY RT-PCR (RSV, FLU A&B, COVID)  RVPGX2 - Abnormal; Notable for the following components:      Result Value   Influenza A by PCR POSITIVE (*)    All other components  within normal limits     EKG N/A.    RADIOLOGY  ED Provider Interpretation: N/A.  No results found.  PROCEDURES:  Critical Care performed: N/A.  Procedures    MEDICATIONS ORDERED IN ED: Medications  acetaminophen (TYLENOL) tablet 650 mg (has no administration in time range)     IMPRESSION / MDM / ASSESSMENT AND PLAN / ED COURSE  I reviewed the triage vital signs and the nursing notes.                              Differential diagnosis includes, but is not limited to, COVID-19, influenza, RSV, community-acquired pneumonia, bronchitis, viral URI   Assessment/Plan Presentation consider with viral URI, likely influenza.  Respiratory panel pending, though unlikely to change management at this time.  She appears well clinically.  Very low suspicion for secondary pneumonia.  Patient declined to undergo antiviral treatment if positive for COVID-19/influenza.  She does not have any significant comorbidities.  Will manage her symptoms with prescription for several call lozenges and meloxicam.  Additionally provide her a note for work.  Advised her to follow-up with the  results of her respiratory panel online and avoid others to prevent further spread of the virus as needed.  She expressed understanding and agreed.  Will discharge.  Provided the patient with anticipatory guidance, return precautions, and educational material. Encouraged the patient to return to the emergency department at any time if they begin to experience any new or worsening symptoms. Patient expressed understanding and agreed with the plan.   Patient's presentation is most consistent with acute complicated illness / injury requiring diagnostic workup.       FINAL CLINICAL IMPRESSION(S) / ED DIAGNOSES   Final diagnoses:  Upper respiratory tract infection, unspecified type     Rx / DC Orders   ED Discharge Orders          Ordered    menthol-cetylpyridinium (CEPACOL REGULAR STRENGTH) 3 MG lozenge   As needed        04/23/22 1253    meloxicam (MOBIC) 15 MG tablet  Daily        04/23/22 1253             Note:  This document was prepared using Dragon voice recognition software and may include unintentional dictation errors.   Varney Daily, [place] 04/23/22 1413    Willy Eddy, MD 04/23/22 [phone]

## 2022-04-23 NOTE — ED Provider Triage Note (Signed)
Emergency Medicine Provider Triage Evaluation Note  Ebony [name] , a 26 y.o. female  was evaluated in triage.  Pt complains of cough, body aches, and headache for the past 3 days.  Kids are sick with the same symptoms.  No abdominal pain or chest pain.   Review of Systems  Positive: cough, body aches, and headache Negative: Abd pain, n/v/d, sob/cp  Physical Exam  There were no vitals taken for this visit. Gen:   Awake, no distress   Resp:  Normal effort  MSK:   Moves extremities without difficulty  Other:    Medical Decision Making  Medically screening exam initiated at 12:16 PM.  Appropriate orders placed.  Ebony [name] was informed that the remainder of the evaluation will be completed by another provider, this initial triage assessment does not replace that evaluation, and the importance of remaining in the ED until their evaluation is complete.     Jackelyn Hoehn, PA-C 04/23/22 1221

## 2022-04-23 NOTE — Discharge Instructions (Addendum)
-  Please review the results of the respiratory panel online.  If positive for COVID-19, influenza, or RSV, please avoid contact with other people to prevent further spread of the virus.  -You may utilize the cepacol lozenges as needed for your sore throat.  You may utilize the meloxicam as needed for body aches.  You may additionally take acetaminophen and other over-the-counter medications as needed.  -Return to the emergency department anytime if you begin to experience any new or worsening symptoms.

## 2022-08-28 ENCOUNTER — Ambulatory Visit: Payer: BC Managed Care – PPO | Admitting: Family Medicine

## 2022-08-28 DIAGNOSIS — Z113 Encounter for screening for infections with a predominantly sexual mode of transmission: Secondary | ICD-10-CM

## 2022-08-28 LAB — WET PREP FOR TRICH, YEAST, CLUE
Trichomonas Exam: NEGATIVE
Yeast Exam: NEGATIVE

## 2022-08-28 LAB — HM HIV SCREENING LAB: HM HIV Screening: NEGATIVE

## 2022-08-28 NOTE — Progress Notes (Signed)
Pt is here for STD screening.  Wet mount results reviewed, no treatment required per SO.  Condoms declined.  Juliona Vales M Mysty Kielty, RN  

## 2022-08-28 NOTE — Progress Notes (Signed)
[facility] Department  STI clinic/screening visit [address] [place] [address] [phone]  Subjective:  Ebony [name] is a 27 y.o. female being seen today for an STI screening visit. The patient reports they do have symptoms.  Patient reports that they do not desire a pregnancy in the next year.   They reported they are not interested in discussing contraception today.    Patient's last menstrual period was 07/14/2022.  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   Does the patient using douching products? No  Last HIV test per patient/review of record was  Lab Results  Component Value Date   HMHIVSCREEN Negative - Validated 10/11/2020    Lab Results  Component Value Date   HIV NONREACTIVE 05/06/2014   Patient reports last pap was No results found for: "DIAGPAP" No results found for: "SPECADGYN"  Screening for MPX risk: Does the patient have an unexplained rash? No Is the patient MSM? No Does the patient endorse multiple sex partners or anonymous sex partners? No Did the patient have close or sexual contact with a person diagnosed with MPX? No Has the patient traveled outside the [place] where MPX is endemic? No Is there a high clinical suspicion for MPX-- evidenced by one of the following No  -Unlikely to be chickenpox  -Lymphadenopathy  -Rash that present in same phase of evolution on any given body part See flowsheet for further details and programmatic requirements.   Immunization history:  Immunization History  Administered Date(s) Administered   DTaP 05/05/1996, 07/05/1996, 09/22/1996, 03/03/1998, 03/10/2000   HIB (PRP-OMP) 05/05/1996,  07/05/1996, 09/22/1996, 03/03/1998   HPV Quadrivalent 04/11/2008, 08/16/2008, 12/31/2011   Hepatitis B 23-Jul-1995, 04/05/1996, 09/22/1996   IPV 05/05/1996, 07/05/1996, 03/17/1997, 03/10/2000   Influenza,inj,Quad PF,6+ Mos 05/07/2014   Influenza-Unspecified 03/09/2014   MMR 03/17/1997, 03/10/2000   Meningococcal Conjugate 03/15/2008   PFIZER(Purple Top)SARS-COV-2 Vaccination 10/28/2019, 11/18/2019   Td 12/28/2007   Tdap 05/07/2014   Varicella 03/17/1997, 03/15/2008     The following portions of the patient's history were reviewed and updated as appropriate: allergies, current medications, past medical history, past social history, past surgical history and problem list.  Objective:  There were no vitals filed for this visit.  Physical Exam Vitals and nursing note reviewed.  Constitutional:      Appearance: Normal appearance.  HENT:     Head: Normocephalic and atraumatic.     Mouth/Throat:     Mouth: Mucous membranes are moist.     Pharynx: Oropharynx is clear. No oropharyngeal exudate or posterior oropharyngeal erythema.  Pulmonary:     Effort: Pulmonary effort is normal.  Abdominal:     General: Abdomen is flat.     Palpations: There is no mass.     Tenderness: There is no abdominal tenderness. There is no rebound.  Genitourinary:    Comments: Pt declined genital exam- self swabbed Lymphadenopathy:     Head:     Right side of head: No preauricular or posterior auricular adenopathy.     Left side of head: No preauricular or posterior auricular adenopathy.     Cervical: No cervical adenopathy.     Upper Body:  Right upper body: No supraclavicular, axillary or epitrochlear adenopathy.     Left upper body: No supraclavicular, axillary or epitrochlear adenopathy.  Skin:    General: Skin is warm and dry.     Findings: No rash.  Neurological:     Mental Status: She is alert and oriented to person, place, and time.    Assessment and Plan:  Ebony [name] is a 27 y.o.  female presenting to the [facility] Department for STI screening  1. Screening for venereal disease  - Chlamydia/Gonorrhea [place] [facility] - HIV [place] [facility] - Syphilis Serology, [place] [facility] - WET PREP FOR TRICH, YEAST, CLUE   Patient accepted all screenings including oral, vaginal CT/GC and bloodwork for HIV/RPR, and wet prep. Patient meets criteria for HepB screening? No. Ordered? not applicable Patient meets criteria for HepC screening? No. Ordered? not applicable  Treat wet prep per standing order Discussed time line for [facility] results and that patient will be called with positive results and encouraged patient to call if she had not heard in 2 weeks.  Counseled to return or seek care for continued or worsening symptoms Recommended repeat testing in 3 months with positive results. Recommended condom use with all sex  Patient is currently using Hormonal Contraception: Injection, Rings and Patches to prevent pregnancy.    Return if symptoms worsen or fail to improve, for STI screening.  No future appointments. Total time spent 20 minutes Lenice Llamas, [place]

## 2022-09-04 ENCOUNTER — Telehealth: Payer: Self-pay | Admitting: Family Medicine

## 2022-09-04 NOTE — Telephone Encounter (Signed)
Please give me a call back to check on my test results

## 2022-09-05 ENCOUNTER — Telehealth: Payer: Self-pay | Admitting: Family Medicine

## 2022-09-05 NOTE — Telephone Encounter (Signed)
Pt calls & states she is having more vaginal burning, irritation, swelling & itching than when she was seen a week ago. Please advise.

## 2022-11-06 ENCOUNTER — Ambulatory Visit (LOCAL_COMMUNITY_HEALTH_CENTER): Payer: BC Managed Care – PPO

## 2022-11-06 VITALS — BP 116/68 | Ht 66.0 in | Wt 197.5 lb

## 2022-11-06 DIAGNOSIS — Z3201 Encounter for pregnancy test, result positive: Secondary | ICD-10-CM

## 2022-11-06 DIAGNOSIS — Z309 Encounter for contraceptive management, unspecified: Secondary | ICD-10-CM

## 2022-11-06 LAB — PREGNANCY, URINE: Preg Test, Ur: POSITIVE — AB

## 2022-11-06 MED ORDER — PRENATAL 27-0.8 MG PO TABS
1.0000 | ORAL_TABLET | Freq: Every day | ORAL | 0 refills | Status: DC
Start: 2022-11-06 — End: 2023-01-03

## 2022-11-06 NOTE — Progress Notes (Signed)
UPT positive. Plans prenatal care at [facility]. Positive preg packet given.  Hx Ectopic preg 2019. Consult Aliene Altes, FNP who advises RN to counsel patient that if she experiences increased abd cramping and / or vaginal bleeding, she should seek immediate med attn/go to ER.   RN counseled pt on provider recommendation. Pt in agreement. Questions answered and reports understanding.   The patient was dispensed prenatal vitamins #100 today per SO Dr Ralene Bathe. I provided counseling today regarding the medication. We discussed the medication, the side effects and when to call clinic. Patient given the opportunity to ask questions. Questions answered.    Sent to DSS for medicaid/preg women. Jerel Shepherd, RN

## 2022-12-31 ENCOUNTER — Other Ambulatory Visit: Payer: Self-pay

## 2022-12-31 ENCOUNTER — Emergency Department
Admission: EM | Admit: 2022-12-31 | Discharge: 2022-12-31 | Disposition: A | Discharge status: Home / Self Care | Payer: Medicaid Other | Attending: Emergency Medicine | Admitting: Emergency Medicine

## 2022-12-31 ENCOUNTER — Encounter: Payer: Self-pay | Admitting: Emergency Medicine

## 2022-12-31 ENCOUNTER — Emergency Department: Payer: Medicaid Other

## 2022-12-31 DIAGNOSIS — O26899 Other specified pregnancy related conditions, unspecified trimester: Secondary | ICD-10-CM

## 2022-12-31 DIAGNOSIS — R509 Fever, unspecified: Secondary | ICD-10-CM | POA: Diagnosis not present

## 2022-12-31 DIAGNOSIS — O2331 Infections of other parts of urinary tract in pregnancy, first trimester: Secondary | ICD-10-CM | POA: Diagnosis not present

## 2022-12-31 DIAGNOSIS — O209 Hemorrhage in early pregnancy, unspecified: Secondary | ICD-10-CM

## 2022-12-31 DIAGNOSIS — O98511 Other viral diseases complicating pregnancy, first trimester: Secondary | ICD-10-CM | POA: Diagnosis not present

## 2022-12-31 DIAGNOSIS — O26851 Spotting complicating pregnancy, first trimester: Secondary | ICD-10-CM | POA: Diagnosis present

## 2022-12-31 DIAGNOSIS — Z1152 Encounter for screening for COVID-19: Secondary | ICD-10-CM | POA: Insufficient documentation

## 2022-12-31 DIAGNOSIS — B349 Viral infection, unspecified: Secondary | ICD-10-CM | POA: Insufficient documentation

## 2022-12-31 DIAGNOSIS — O3680X0 Pregnancy with inconclusive fetal viability, not applicable or unspecified: Secondary | ICD-10-CM

## 2022-12-31 DIAGNOSIS — O039 Complete or unspecified spontaneous abortion without complication: Secondary | ICD-10-CM | POA: Diagnosis not present

## 2022-12-31 DIAGNOSIS — N390 Urinary tract infection, site not specified: Secondary | ICD-10-CM

## 2022-12-31 DIAGNOSIS — Z3A11 11 weeks gestation of pregnancy: Secondary | ICD-10-CM | POA: Diagnosis not present

## 2022-12-31 DIAGNOSIS — O200 Threatened abortion: Secondary | ICD-10-CM | POA: Insufficient documentation

## 2022-12-31 LAB — COMPREHENSIVE METABOLIC PANEL
ALT: 35 U/L (ref 0–44)
AST: 29 U/L (ref 15–41)
Albumin: 3.1 g/dL — ABNORMAL LOW (ref 3.5–5.0)
Alkaline Phosphatase: 39 U/L (ref 38–126)
Anion gap: 7 (ref 5–15)
BUN: 9 mg/dL (ref 6–20)
CO2: 20 mmol/L — ABNORMAL LOW (ref 22–32)
Calcium: 8.7 mg/dL — ABNORMAL LOW (ref 8.9–10.3)
Chloride: 106 mmol/L (ref 98–111)
Creatinine, Ser: 0.5 mg/dL (ref 0.44–1.00)
GFR, Estimated: >60 mL/min (ref >60)
Glucose, Bld: 97 mg/dL (ref 70–99)
Potassium: 3.4 mmol/L — ABNORMAL LOW (ref 3.5–5.1)
Sodium: 133 mmol/L — ABNORMAL LOW (ref 135–145)
Total Bilirubin: 0.7 mg/dL (ref 0.3–1.2)
Total Protein: 6.8 g/dL (ref 6.5–8.1)

## 2022-12-31 LAB — CBC WITH DIFFERENTIAL/PLATELET
Abs Immature Granulocytes: 0.03 10*3/uL (ref 0.00–0.07)
Basophils Absolute: 0 10*3/uL (ref 0.0–0.1)
Basophils Relative: 0 %
Eosinophils Absolute: 0.1 10*3/uL (ref 0.0–0.5)
Eosinophils Relative: 1 %
HCT: 32.8 % — ABNORMAL LOW (ref 36.0–46.0)
Hemoglobin: 11 g/dL — ABNORMAL LOW (ref 12.0–15.0)
Immature Granulocytes: 0 %
Lymphocytes Relative: 21 %
Lymphs Abs: 2 10*3/uL (ref 0.7–4.0)
MCH: 30.6 pg (ref 26.0–34.0)
MCHC: 33.5 g/dL (ref 30.0–36.0)
MCV: 91.4 fL (ref 80.0–100.0)
Monocytes Absolute: 0.8 10*3/uL (ref 0.1–1.0)
Monocytes Relative: 8 %
Neutro Abs: 6.5 10*3/uL (ref 1.7–7.7)
Neutrophils Relative %: 70 %
Platelets: 188 10*3/uL (ref 150–400)
RBC: 3.59 MIL/uL — ABNORMAL LOW (ref 3.87–5.11)
RDW: 12.6 % (ref 11.5–15.5)
WBC: 9.4 10*3/uL (ref 4.0–10.5)
nRBC: 0 % (ref 0.0–0.2)

## 2022-12-31 LAB — RESP PANEL BY RT-PCR (RSV, FLU A&B, COVID)  RVPGX2
Influenza A by PCR: NEGATIVE
Influenza B by PCR: NEGATIVE
Resp Syncytial Virus by PCR: NEGATIVE
SARS Coronavirus 2 by RT PCR: NEGATIVE

## 2022-12-31 LAB — LACTIC ACID, PLASMA: Lactic Acid, Venous: 1 mmol/L (ref 0.5–1.9)

## 2022-12-31 LAB — URINALYSIS, W/ REFLEX TO CULTURE (INFECTION SUSPECTED)
Bilirubin Urine: NEGATIVE
Glucose, UA: NEGATIVE mg/dL
Ketones, ur: NEGATIVE mg/dL
Nitrite: NEGATIVE
Protein, ur: 30 mg/dL — AB
Specific Gravity, Urine: 1.019 (ref 1.005–1.030)
WBC, UA: >50 WBC/hpf (ref 0–5)
pH: 6 (ref 5.0–8.0)

## 2022-12-31 LAB — HCG, QUANTITATIVE, PREGNANCY: hCG, Beta Chain, Quant, S: 55429 m[IU]/mL — ABNORMAL HIGH (ref <5)

## 2022-12-31 LAB — POC URINE PREG, ED: Preg Test, Ur: POSITIVE — AB

## 2022-12-31 MED ORDER — CEPHALEXIN 500 MG PO CAPS: 500.0000 mg | ORAL_CAPSULE | Freq: Four times a day (QID) | ORAL | 0 refills | Status: DC

## 2022-12-31 MED ORDER — DOXYLAMINE-PYRIDOXINE 10-10 MG PO TBEC: 1.0000 | DELAYED_RELEASE_TABLET | Freq: Two times a day (BID) | ORAL | 0 refills | Status: DC | PRN

## 2022-12-31 MED ORDER — SODIUM CHLORIDE 0.9 % IV BOLUS
1000.0000 mL | Freq: Once | INTRAVENOUS | Status: AC
  Administered 2022-12-31: 1000 mL via INTRAVENOUS

## 2022-12-31 MED ORDER — SODIUM CHLORIDE 0.9 % IV SOLN
1.0000 g | Freq: Once | INTRAVENOUS | Status: AC
  Administered 2022-12-31: 1 g via INTRAVENOUS
  Filled 2022-12-31: qty 10

## 2022-12-31 MED ORDER — ACETAMINOPHEN 500 MG PO TABS
1000.0000 mg | ORAL_TABLET | Freq: Once | ORAL | Status: AC
  Administered 2022-12-31: 1000 mg via ORAL
  Filled 2022-12-31: qty 2

## 2022-12-31 NOTE — ED Notes (Signed)
Pt left to US

## 2022-12-31 NOTE — ED Triage Notes (Signed)
Pt presents ambulatory to triage via POV with complaints of body aches x 2 days. Pt endorses a mild headache and coughing. Of note, pt is [redacted] weeks pregnant. A&Ox4 at this time. Denies CP or SOB.

## 2022-12-31 NOTE — Discharge Instructions (Addendum)
Take Tylenol 650 mg every 6 hours as needed for pain and fever. Take Keflex antibiotic for urinary tract infection. Drink plenty of fluids to stay well-hydrated. If you have nausea, prescribed a nausea medicine for you to take  Call your [facility] gynecologist to establish care for this pregnancy.  Have them recheck your beta-hCG pregnancy levels within the week and do a repeat ultrasound because your ultrasound today did not show signs of active pregnancy which may represent an early ectopic pregnancy or an ongoing miscarriage.  Thank you for choosing [place] for your health care today!  Please see your primary doctor this week for a follow up appointment.   If you have any new, worsening, or unexpected symptoms call your doctor right away or come back to the emergency department for reevaluation.  It was my pleasure to care for you today.   Daneil Dan [place] Charon, MD

## 2022-12-31 NOTE — ED Provider Notes (Addendum)
[facility] Provider Note    Event Date/Time   First MD Initiated Contact with Patient 12/31/22 0424     (approximate)   History   Generalized Body Aches   HPI  Ebony [name] is a 27 y.o. female   Past medical history of no significant past medical history presents emergency department with myalgias, lower abdominal cramping pain, vaginal spotting, fever for the last 2 days.  Denies respiratory infectious symptoms.  No dysuria or frequency.  No vaginal discharge other than some mild spotting.  She is approximately [redacted] weeks pregnant by last menstrual period.  Has not yet established care for this pregnancy by her [facility] gynecologist yet.  No known sick contacts.   External Medical Documents Reviewed: Lab testing done at outside hospital that shows Rh+      Physical Exam   Triage Vital Signs: ED Triage Vitals [12/31/22 0317]  Encounter Vitals Group     BP 132/87     Systolic BP Percentile      Diastolic BP Percentile      Pulse Rate (!) 120     Resp 20     Temp (!) 101.3 F (38.5 C)     Temp Source Oral     SpO2 100 %     Weight 196 lb 13.9 oz (89.3 kg)     Height 5\' 6"  (1.676 m)     Head Circumference      Peak Flow      Pain Score 0     Pain Loc      Pain Education      Exclude from Growth Chart     Most recent vital signs: Vitals:   12/31/22 0317 12/31/22 0658  BP: 132/87 109/66  Pulse: (!) 120 87  Resp: 20 20  Temp: (!) 101.3 F (38.5 C) 98.7 F (37.1 C)  SpO2: 100% 100%    General: Awake, no distress.  CV:  Good peripheral perfusion.  Resp:  Normal effort.  Abd:  No distention.  Other:  Awake alert comfortable appearing nontoxic appearing pleasant in no acute distress.  Clear lungs, soft nontender abdomen to deep palpation in all quadrants.  Neck supple with full range of motion.   ED Results / Procedures / Treatments   Labs (all labs ordered are listed, but only abnormal results are displayed) Labs Reviewed   URINALYSIS, W/ REFLEX TO CULTURE (INFECTION SUSPECTED) - Abnormal; Notable for the following components:      Result Value   Color, Urine YELLOW (*)    APPearance CLOUDY (*)    Hgb urine dipstick LARGE (*)    Protein, ur 30 (*)    Leukocytes,Ua LARGE (*)    Bacteria, UA MANY (*)    All other components within normal limits  COMPREHENSIVE METABOLIC PANEL - Abnormal; Notable for the following components:   Sodium 133 (*)    Potassium 3.4 (*)    CO2 20 (*)    Calcium 8.7 (*)    Albumin 3.1 (*)    All other components within normal limits  CBC WITH DIFFERENTIAL/PLATELET - Abnormal; Notable for the following components:   RBC 3.59 (*)    Hemoglobin 11.0 (*)    HCT 32.8 (*)    All other components within normal limits  HCG, QUANTITATIVE, PREGNANCY - Abnormal; Notable for the following components:   hCG, Beta Chain, Quant, S 55,429 (*)    All other components within normal limits  POC URINE PREG, ED -  Abnormal; Notable for the following components:   Preg Test, Ur POSITIVE (*)    All other components within normal limits  RESP PANEL BY RT-PCR (RSV, FLU A&B, COVID)  RVPGX2  CULTURE, BLOOD (ROUTINE X 2)  CULTURE, BLOOD (ROUTINE X 2)  URINE CULTURE  LACTIC ACID, PLASMA  LACTIC ACID, PLASMA     I ordered and reviewed the above labs they are notable for viral respiratory panel which is negative for flu or COVID  EKG  ED ECG REPORT I, Pilar Jarvis, the attending physician, personally viewed and interpreted this ECG.   Date: 12/31/2022  EKG Time: 0656  Rate: 90  Rhythm: sinus  Axis: nl  Intervals:none  ST&T Change: no stemi    RADIOLOGY I independently reviewed and interpreted chest x-ray and see no obvious focality or pneumothorax I also reviewed radiologist's formal read.   PROCEDURES:  Critical Care performed: Yes, see critical care procedure note(s)  .Critical Care  Performed by: Pilar Jarvis, MD Authorized by: Pilar Jarvis, MD   Critical care provider statement:     Critical care time (minutes):  30   Critical care was time spent personally by me on the following activities:  Development of treatment plan with patient or surrogate, discussions with consultants, evaluation of patient's response to treatment, examination of patient, ordering and review of laboratory studies, ordering and review of radiographic studies, ordering and performing treatments and interventions, pulse oximetry, re-evaluation of patient's condition and review of old charts    MEDICATIONS ORDERED IN ED: Medications  cefTRIAXone (ROCEPHIN) 1 g in sodium chloride 0.9 % 100 mL IVPB (1 g Intravenous New Bag/Given 12/31/22 0703)  acetaminophen (TYLENOL) tablet 1,000 mg (1,000 mg Oral Given 12/31/22 0322)  sodium chloride 0.9 % bolus 1,000 mL (1,000 mLs Intravenous New Bag/Given 12/31/22 0526)  sodium chloride 0.9 % bolus 1,000 mL (1,000 mLs Intravenous New Bag/Given 12/31/22 0526)     IMPRESSION / MDM / ASSESSMENT AND PLAN / ED COURSE  I reviewed the triage vital signs and the nursing notes.                                Patient's presentation is most consistent with acute presentation with potential threat to life or bodily function.  Differential diagnosis includes, but is not limited to, viral syndrome, urinary tract infection, respiratory infection, intra-abdominal infection, sepsis, ectopic pregnancy, TOA, threatened miscarriage   The patient is on the cardiac monitor to evaluate for evidence of arrhythmia and/or significant heart rate changes.  MDM:    Patient with febrile illness and myalgias most consistent with viral illness.  However she is febrile and tachycardic I'm concerned for sepsis so will obtain sepsis labs including lactic and blood culture and give IV crystalloid bolus.  Given most likely viral illness and no obvious bacterial source or focal infectious symptoms, will defer antibiotics at this time.  Check UA and chest x-ray.  Check viral swabs.  In terms of  her early pregnancy no established care no definitive IUP confirmed yet, obtain transvaginal ultrasound rule out ectopic.  She is Rh+ and has scant vaginal bleeding today, no indication for RhoGAM.  Check quantitative hCG to facilitate follow-up for threatened miscarriage.  Considered intra-abdominal infection like appendicitis or other surgical pathologies but doubt given soft nontender benign abdominal exam.   -- She has a contaminated urine sample bacteriuria with inflammatory changes, given her infection workup with fever or tachycardia I will  treat her with ceftriaxone now and a pyelonephritis course of antibiotics Keflex, urine culture sent as well.  She continues to look well, her vital signs have normalized with Tylenol fever has defervesced and her tachycardia is now resolved with fluids.  Her ultrasound was completed and shows no intrauterine pregnancy but some thickening of the endometrium perhaps representing an ongoing miscarriage.  There was no adnexal masses, doubt ectopic pregnancy.  She understands to follow-up with her gynecologist at [facility] and will give them a call later this morning.  She understands to return with any new or worsening symptoms.        FINAL CLINICAL IMPRESSION(S) / ED DIAGNOSES   Final diagnoses:  Viral syndrome  Fever, unspecified fever cause  Abdominal pain affecting pregnancy  Vaginal bleeding affecting early pregnancy  Threatened miscarriage  Urinary tract infection without hematuria, site unspecified     Rx / DC Orders   ED Discharge Orders          Ordered    cephALEXin (KEFLEX) 500 MG capsule  4 times daily,   Status:  Discontinued        12/31/22 0656    cephALEXin (KEFLEX) 500 MG capsule  4 times daily        12/31/22 0700    Doxylamine-Pyridoxine (DICLEGIS) 10-10 MG TBEC  2 times daily PRN        12/31/22 0525    cephALEXin (KEFLEX) 500 MG capsule  4 times daily,   Status:  Discontinued        12/31/22 0656              Note:  This document was prepared using Dragon voice recognition software and may include unintentional dictation errors.    Pilar Jarvis, MD 12/31/22 5573    Pilar Jarvis, MD 12/31/22 [phone]

## 2022-12-31 NOTE — ED Notes (Signed)
Will discharge after rocephin finished. TVUS has not resulted yet. EDP has spoken with pt at bedside regarding discharge.

## 2023-01-02 ENCOUNTER — Other Ambulatory Visit: Payer: Self-pay

## 2023-01-02 DIAGNOSIS — R1084 Generalized abdominal pain: Secondary | ICD-10-CM

## 2023-01-02 LAB — URINE CULTURE: Culture: 20000 — AB

## 2023-01-03 ENCOUNTER — Encounter: Payer: Self-pay | Admitting: Obstetrics

## 2023-01-03 ENCOUNTER — Ambulatory Visit (INDEPENDENT_AMBULATORY_CARE_PROVIDER_SITE_OTHER): Payer: Medicaid Other | Admitting: Obstetrics

## 2023-01-03 VITALS — BP 109/65 | HR 96 | Ht 64.0 in | Wt 210.0 lb

## 2023-01-03 DIAGNOSIS — Z3A13 13 weeks gestation of pregnancy: Secondary | ICD-10-CM

## 2023-01-03 DIAGNOSIS — O039 Complete or unspecified spontaneous abortion without complication: Secondary | ICD-10-CM

## 2023-01-03 DIAGNOSIS — O26899 Other specified pregnancy related conditions, unspecified trimester: Secondary | ICD-10-CM

## 2023-01-03 DIAGNOSIS — R1084 Generalized abdominal pain: Secondary | ICD-10-CM | POA: Diagnosis not present

## 2023-01-03 DIAGNOSIS — O26891 Other specified pregnancy related conditions, first trimester: Secondary | ICD-10-CM | POA: Diagnosis not present

## 2023-01-03 NOTE — Progress Notes (Signed)
    GYNECOLOGY PROGRESS NOTE  Subjective:    Patient ID: LEORAH PEZZUTI, female    DOB: 01-22-1996, 27 y.o.   MRN: [id number]    Patient is a 27 y.o. [phone] female who presents for follow up from ED. She was evaluated in ED on 08/27 for myalgias, lower abdominal cramping pain, vaginal spotting, fever for the last 2 days. She is approximately [redacted]w[redacted]d pregnant by last menstrual period. She had ultrasound done in ED which revealed possible miscarriage. She had a beta HCG done and her levels were 55,429 3 days ago. She is here to follow up and repeat beta hcg. She reports that she continues to have cramping and bleeding.  Her bleeding is moderate to heavy today, and she is passing clots.  Her ultrasound shows no intrauterine pregnancy. She believes that she is miscarrying.  The following portions of the patient's history were reviewed and updated as appropriate: allergies, current medications, past family history, past medical history, past social history, past surgical history, and problem list.  Review of Systems Pertinent items noted in HPI and remainder of comprehensive ROS otherwise negative.   Objective:   Blood pressure 109/65, pulse 96, height 5\' 4"  (1.626 m), weight 210 lb (95.3 kg), last menstrual period 10/03/2022, not currently breastfeeding. Body mass index is 36.05 kg/m. General appearance: alert, cooperative, and no distress Abdomen: soft, non-tender; bowel sounds normal; no masses,  no organomegaly Pelvic: cervix normal in appearance, external genitalia normal, no adnexal masses or tenderness, no cervical motion tenderness, and uterus normal size, shape, and consistency Per speculum exam, moderate amounts of vaginal bleeding noted. No clots seen. Extremities: extremities normal, atraumatic, no cyanosis or edema Neurologic: Grossly normal   Ultrasound  CLINICAL DATA:  Lower abdominal pain with fever for 1 day. Quantitative beta HCG of 55,000. Twelve week 5 day gestational  age by LMP.   EXAM: OBSTETRIC <14 WK [place] AND TRANSVAGINAL OB US   TECHNIQUE: Both transabdominal and transvaginal ultrasound examinations were performed for complete evaluation of the gestation as well as the maternal uterus, adnexal regions, and pelvic cul-de-sac. Transvaginal technique was performed to assess early pregnancy.   COMPARISON:  None Available.   FINDINGS: No intra or extra uterine gestational sac is seen. The uterus is enlarged to 17 x 6 x 12 cm with thickened endometrium and heterogeneity especially at the lower uterine segment. Endometrial stripe measures 2.3 cm. Increased, possibly trophoblastic color Doppler flow to the endometrial cavity. No adnexal mass or pelvic fluid.   IMPRESSION: Pregnancy of unknown location with heterogeneous thickened appearance of the endometrium, question abortion in progress. Consider follow-up ultrasound and serial quantitative beta HCG follow-up.    Assessment:   1. Pregnancy with generalized abdominal pain, antepartum      Plan:   1. Pregnancy with generalized abdominal pain, antepartum Miscarriage in progress. - Beta hCG quant (ref lab) We will review the Beta results. I have asked her to RTC next week for another Quant, and will order a scan if necessary. She verbalized understanding.     Paula Compton, CNM [facility]

## 2023-01-04 ENCOUNTER — Encounter
Admission: EM | Disposition: A | Discharge status: Home / Self Care | Payer: Self-pay | Source: Home / Self Care | Attending: Emergency Medicine

## 2023-01-04 ENCOUNTER — Other Ambulatory Visit: Payer: Self-pay

## 2023-01-04 ENCOUNTER — Emergency Department: Payer: Medicaid Other | Admitting: Certified Registered"

## 2023-01-04 ENCOUNTER — Ambulatory Visit
Admission: EM | Admit: 2023-01-04 | Discharge: 2023-01-04 | Disposition: A | Discharge status: Home / Self Care | Payer: Medicaid Other | Attending: Emergency Medicine | Admitting: Emergency Medicine

## 2023-01-04 ENCOUNTER — Emergency Department: Payer: Medicaid Other

## 2023-01-04 DIAGNOSIS — O039 Complete or unspecified spontaneous abortion without complication: Secondary | ICD-10-CM

## 2023-01-04 DIAGNOSIS — O4690 Antepartum hemorrhage, unspecified, unspecified trimester: Secondary | ICD-10-CM

## 2023-01-04 DIAGNOSIS — O031 Delayed or excessive hemorrhage following incomplete spontaneous abortion: Secondary | ICD-10-CM | POA: Diagnosis not present

## 2023-01-04 DIAGNOSIS — D649 Anemia, unspecified: Secondary | ICD-10-CM

## 2023-01-04 DIAGNOSIS — I959 Hypotension, unspecified: Secondary | ICD-10-CM

## 2023-01-04 DIAGNOSIS — D62 Acute posthemorrhagic anemia: Secondary | ICD-10-CM | POA: Diagnosis not present

## 2023-01-04 DIAGNOSIS — Z3A01 Less than 8 weeks gestation of pregnancy: Secondary | ICD-10-CM

## 2023-01-04 DIAGNOSIS — Z3A12 12 weeks gestation of pregnancy: Secondary | ICD-10-CM | POA: Insufficient documentation

## 2023-01-04 DIAGNOSIS — Z833 Family history of diabetes mellitus: Secondary | ICD-10-CM | POA: Diagnosis not present

## 2023-01-04 DIAGNOSIS — O036 Delayed or excessive hemorrhage following complete or unspecified spontaneous abortion: Secondary | ICD-10-CM | POA: Insufficient documentation

## 2023-01-04 HISTORY — PX: DILATION AND EVACUATION: SHX1459

## 2023-01-04 LAB — CBC WITH DIFFERENTIAL/PLATELET
Abs Immature Granulocytes: 0.05 10*3/uL (ref 0.00–0.07)
Basophils Absolute: 0 10*3/uL (ref 0.0–0.1)
Basophils Relative: 0 %
Eosinophils Absolute: 0.1 10*3/uL (ref 0.0–0.5)
Eosinophils Relative: 1 %
HCT: 23.9 % — ABNORMAL LOW (ref 36.0–46.0)
Hemoglobin: 7.9 g/dL — ABNORMAL LOW (ref 12.0–15.0)
Immature Granulocytes: 0 %
Lymphocytes Relative: 27 %
Lymphs Abs: 3.1 10*3/uL (ref 0.7–4.0)
MCH: 29.7 pg (ref 26.0–34.0)
MCHC: 33.1 g/dL (ref 30.0–36.0)
MCV: 89.8 fL (ref 80.0–100.0)
Monocytes Absolute: 0.9 10*3/uL (ref 0.1–1.0)
Monocytes Relative: 8 %
Neutro Abs: 7.4 10*3/uL (ref 1.7–7.7)
Neutrophils Relative %: 64 %
Platelets: 268 10*3/uL (ref 150–400)
RBC: 2.66 MIL/uL — ABNORMAL LOW (ref 3.87–5.11)
RDW: 12.2 % (ref 11.5–15.5)
WBC: 11.6 10*3/uL — ABNORMAL HIGH (ref 4.0–10.5)
nRBC: 0.2 % (ref 0.0–0.2)

## 2023-01-04 LAB — COMPREHENSIVE METABOLIC PANEL
ALT: 13 U/L (ref 0–44)
AST: 16 U/L (ref 15–41)
Albumin: 2.4 g/dL — ABNORMAL LOW (ref 3.5–5.0)
Alkaline Phosphatase: 47 U/L (ref 38–126)
Anion gap: 8 (ref 5–15)
BUN: 17 mg/dL (ref 6–20)
CO2: 18 mmol/L — ABNORMAL LOW (ref 22–32)
Calcium: 8.1 mg/dL — ABNORMAL LOW (ref 8.9–10.3)
Chloride: 106 mmol/L (ref 98–111)
Creatinine, Ser: 0.61 mg/dL (ref 0.44–1.00)
GFR, Estimated: >60 mL/min (ref >60)
Glucose, Bld: 177 mg/dL — ABNORMAL HIGH (ref 70–99)
Potassium: 3.2 mmol/L — ABNORMAL LOW (ref 3.5–5.1)
Sodium: 132 mmol/L — ABNORMAL LOW (ref 135–145)
Total Bilirubin: 0.1 mg/dL — ABNORMAL LOW (ref 0.3–1.2)
Total Protein: 5.7 g/dL — ABNORMAL LOW (ref 6.5–8.1)

## 2023-01-04 LAB — BETA HCG QUANT (REF LAB): hCG Quant: 22984 m[IU]/mL

## 2023-01-04 LAB — PREPARE RBC (CROSSMATCH)

## 2023-01-04 LAB — HCG, QUANTITATIVE, PREGNANCY: hCG, Beta Chain, Quant, S: 14370 m[IU]/mL — ABNORMAL HIGH (ref <5)

## 2023-01-04 SURGERY — DILATION AND EVACUATION, UTERUS
Anesthesia: General

## 2023-01-04 MED ORDER — SUCCINYLCHOLINE CHLORIDE 200 MG/10ML IV SOSY
PREFILLED_SYRINGE | INTRAVENOUS | Status: DC | PRN
  Administered 2023-01-04: 120 mg via INTRAVENOUS

## 2023-01-04 MED ORDER — FENTANYL CITRATE (PF) 100 MCG/2ML IJ SOLN: 25.0000 ug | INTRAMUSCULAR | Status: DC | PRN

## 2023-01-04 MED ORDER — PHENYLEPHRINE HCL (PRESSORS) 10 MG/ML IV SOLN
INTRAVENOUS | Status: DC | PRN
Start: 2023-01-04 — End: 2023-01-04
  Administered 2023-01-04: 160 ug via INTRAVENOUS

## 2023-01-04 MED ORDER — OXYTOCIN 10 UNIT/ML IJ SOLN
INTRAMUSCULAR | Status: AC
  Filled 2023-01-04: qty 2

## 2023-01-04 MED ORDER — ACETAMINOPHEN 10 MG/ML IV SOLN: 1000.0000 mg | Freq: Once | INTRAVENOUS | Status: DC | PRN

## 2023-01-04 MED ORDER — LIDOCAINE HCL (PF) 2 % IJ SOLN
INTRAMUSCULAR | Status: AC
  Filled 2023-01-04: qty 5

## 2023-01-04 MED ORDER — ONDANSETRON HCL 4 MG/2ML IJ SOLN
INTRAMUSCULAR | Status: DC | PRN
  Administered 2023-01-04: 4 mg via INTRAVENOUS

## 2023-01-04 MED ORDER — PROPOFOL 10 MG/ML IV BOLUS
INTRAVENOUS | Status: DC | PRN
  Administered 2023-01-04: 150 mg via INTRAVENOUS

## 2023-01-04 MED ORDER — DEXAMETHASONE SODIUM PHOSPHATE 10 MG/ML IJ SOLN
INTRAMUSCULAR | Status: AC
  Filled 2023-01-04: qty 1

## 2023-01-04 MED ORDER — KETOROLAC TROMETHAMINE 30 MG/ML IJ SOLN
30.0000 mg | Freq: Once | INTRAMUSCULAR | Status: AC
  Administered 2023-01-04: 30 mg via INTRAVENOUS

## 2023-01-04 MED ORDER — ONDANSETRON HCL 4 MG/2ML IJ SOLN
4.0000 mg | Freq: Once | INTRAMUSCULAR | Status: AC | PRN
  Administered 2023-01-04: 4 mg via INTRAVENOUS

## 2023-01-04 MED ORDER — FENTANYL CITRATE (PF) 100 MCG/2ML IJ SOLN
INTRAMUSCULAR | Status: DC | PRN
  Administered 2023-01-04: 50 ug via INTRAVENOUS

## 2023-01-04 MED ORDER — FENTANYL CITRATE PF 50 MCG/ML IJ SOSY
50.0000 ug | PREFILLED_SYRINGE | Freq: Once | INTRAMUSCULAR | Status: AC
  Administered 2023-01-04: 50 ug via INTRAVENOUS
  Filled 2023-01-04: qty 1

## 2023-01-04 MED ORDER — ONDANSETRON HCL 4 MG/2ML IJ SOLN
INTRAMUSCULAR | Status: AC
  Filled 2023-01-04: qty 2

## 2023-01-04 MED ORDER — OXYTOCIN 10 UNIT/ML IJ SOLN
INTRAMUSCULAR | Status: DC | PRN
Start: 2023-01-04 — End: 2023-01-04
  Administered 2023-01-04: 30 [IU] via INTRAMUSCULAR

## 2023-01-04 MED ORDER — MIDAZOLAM HCL 2 MG/2ML IJ SOLN
INTRAMUSCULAR | Status: AC
  Filled 2023-01-04: qty 2

## 2023-01-04 MED ORDER — DEXTROSE IN LACTATED RINGERS 5 % IV SOLN: INTRAVENOUS | Status: DC

## 2023-01-04 MED ORDER — OXYCODONE-ACETAMINOPHEN 5-325 MG PO TABS: 1.0000 | ORAL_TABLET | ORAL | Status: DC | PRN

## 2023-01-04 MED ORDER — SODIUM CHLORIDE 0.9 % IV SOLN: INTRAVENOUS | Status: DC | PRN

## 2023-01-04 MED ORDER — FENTANYL CITRATE (PF) 100 MCG/2ML IJ SOLN
INTRAMUSCULAR | Status: AC
  Filled 2023-01-04: qty 2

## 2023-01-04 MED ORDER — PROPOFOL 10 MG/ML IV BOLUS
INTRAVENOUS | Status: AC
  Filled 2023-01-04: qty 20

## 2023-01-04 MED ORDER — OXYCODONE HCL 5 MG/5ML PO SOLN: 5.0000 mg | Freq: Once | ORAL | Status: DC | PRN

## 2023-01-04 MED ORDER — 0.9 % SODIUM CHLORIDE (POUR BTL) OPTIME
TOPICAL | Status: DC | PRN
  Administered 2023-01-04: 1000 mL

## 2023-01-04 MED ORDER — OXYTOCIN 10 UNIT/ML IJ SOLN
INTRAMUSCULAR | Status: AC
  Filled 2023-01-04: qty 1

## 2023-01-04 MED ORDER — LACTATED RINGERS IV SOLN: INTRAVENOUS | Status: DC

## 2023-01-04 MED ORDER — SODIUM CHLORIDE 0.9 % IV SOLN
10.0000 mL/h | Freq: Once | INTRAVENOUS | Status: AC
  Administered 2023-01-04: 10 mL/h via INTRAVENOUS

## 2023-01-04 MED ORDER — ONDANSETRON HCL 4 MG/2ML IJ SOLN: 4.0000 mg | Freq: Four times a day (QID) | INTRAMUSCULAR | Status: DC | PRN

## 2023-01-04 MED ORDER — LACTATED RINGERS IV BOLUS
1000.0000 mL | Freq: Once | INTRAVENOUS | Status: AC
  Administered 2023-01-04: 1000 mL via INTRAVENOUS

## 2023-01-04 MED ORDER — DEXAMETHASONE SODIUM PHOSPHATE 10 MG/ML IJ SOLN
INTRAMUSCULAR | Status: DC | PRN
  Administered 2023-01-04: 10 mg via INTRAVENOUS

## 2023-01-04 MED ORDER — ONDANSETRON 4 MG PO TBDP: 4.0000 mg | ORAL_TABLET | Freq: Four times a day (QID) | ORAL | Status: DC | PRN

## 2023-01-04 MED ORDER — LIDOCAINE HCL (CARDIAC) PF 100 MG/5ML IV SOSY
PREFILLED_SYRINGE | INTRAVENOUS | Status: DC | PRN
  Administered 2023-01-04: 100 mg via INTRAVENOUS

## 2023-01-04 MED ORDER — OXYCODONE HCL 5 MG PO TABS: 5.0000 mg | ORAL_TABLET | Freq: Once | ORAL | Status: DC | PRN

## 2023-01-04 MED ORDER — MIDAZOLAM HCL 2 MG/2ML IJ SOLN
INTRAMUSCULAR | Status: DC | PRN
  Administered 2023-01-04: 2 mg via INTRAVENOUS

## 2023-01-04 MED ORDER — KETOROLAC TROMETHAMINE 30 MG/ML IJ SOLN
INTRAMUSCULAR | Status: AC
  Filled 2023-01-04: qty 1

## 2023-01-04 MED ORDER — ONDANSETRON HCL 4 MG/2ML IJ SOLN
4.0000 mg | Freq: Once | INTRAMUSCULAR | Status: AC
  Administered 2023-01-04: 4 mg via INTRAVENOUS
  Filled 2023-01-04: qty 2

## 2023-01-04 MED ORDER — POVIDONE-IODINE 10 % EX SWAB: 2.0000 | Freq: Once | CUTANEOUS | Status: DC

## 2023-01-04 MED ORDER — HYDROCODONE-ACETAMINOPHEN 5-325 MG PO TABS: 1.0000 | ORAL_TABLET | Freq: Four times a day (QID) | ORAL | 0 refills | Status: AC | PRN

## 2023-01-04 MED ORDER — MISOPROSTOL 200 MCG PO TABS
800.0000 ug | ORAL_TABLET | Freq: Once | ORAL | Status: AC
  Administered 2023-01-04: 800 ug via RECTAL
  Filled 2023-01-04: qty 4

## 2023-01-04 SURGICAL SUPPLY — 25 items
CUP MEDICINE 2OZ PLAST GRAD ST (MISCELLANEOUS) ×1 IMPLANT
DRSG TELFA 3X8 NADH STRL (GAUZE/BANDAGES/DRESSINGS) ×1 IMPLANT
FILTER UTR ASPR SPEC (MISCELLANEOUS) ×1 IMPLANT
FLTR UTR ASPR SPEC (MISCELLANEOUS) ×1
GLOVE PI ORTHO PRO STRL 7.5 (GLOVE) ×1 IMPLANT
GOWN STRL REUS W/ TWL LRG LVL3 (GOWN DISPOSABLE) ×2 IMPLANT
GOWN STRL REUS W/TWL LRG LVL3 (GOWN DISPOSABLE) ×2
GRADUATE 1200CC STRL 31836 (MISCELLANEOUS) ×1 IMPLANT
KIT BERKELEY 1ST TRIMESTER 3/8 (MISCELLANEOUS) ×1 IMPLANT
KIT TURNOVER KIT A (KITS) ×1 IMPLANT
MANIFOLD NEPTUNE II (INSTRUMENTS) IMPLANT
NDL HYPO 25X1 1.5 SAFETY (NEEDLE) ×1 IMPLANT
NEEDLE HYPO 25X1 1.5 SAFETY (NEEDLE) ×1 IMPLANT
PACK DNC HYST (MISCELLANEOUS) ×1 IMPLANT
PAD OB MATERNITY 4.3X12.25 (PERSONAL CARE ITEMS) ×1 IMPLANT
PAD PREP OB/GYN DISP 24X41 (PERSONAL CARE ITEMS) ×1 IMPLANT
SCRUB CHG 4% DYNA-HEX 4OZ (MISCELLANEOUS) ×1 IMPLANT
SET BERKELEY SUCTION TUBING (SUCTIONS) ×1 IMPLANT
SET CYSTO W/LG BORE CLAMP LF (SET/KITS/TRAYS/PACK) IMPLANT
TRAP FLUID SMOKE EVACUATOR (MISCELLANEOUS) IMPLANT
VACURETTE 10 RIGID CVD (CANNULA) IMPLANT
VACURETTE 12 RIGID CVD (CANNULA) IMPLANT
VACURETTE 7MM F TIP STRL (CANNULA) IMPLANT
VACURETTE 8 RIGID CVD (CANNULA) IMPLANT
WATER STERILE IRR 500ML POUR (IV SOLUTION) ×1 IMPLANT

## 2023-01-04 NOTE — OR Nursing (Signed)
Three white tablets were noted at the surgical site by the surgeon at the beginning of the procedure. Surgeon cleared the tablets from the surgical site and continued with the procedure.

## 2023-01-04 NOTE — Anesthesia Procedure Notes (Signed)
Procedure Name: Intubation Date/Time: 01/04/2023 7:02 AM  Performed by: [name] Fabian, CRNAPre-anesthesia Checklist: Patient identified, Emergency Drugs available, Suction available and Patient being monitored Patient Re-evaluated:Patient Re-evaluated prior to induction Oxygen Delivery Method: Circle system utilized Preoxygenation: Pre-oxygenation with 100% oxygen Induction Type: IV induction, Cricoid Pressure applied and Rapid sequence Ventilation: Mask ventilation without difficulty Laryngoscope Size: Mac and 3 Grade View: Grade II Tube type: Oral Tube size: 7.0 mm Number of attempts: 1 Airway Equipment and Method: Stylet Placement Confirmation: ETT inserted through vocal cords under direct vision, positive ETCO2 and breath sounds checked- equal and bilateral Secured at: 21 cm Tube secured with: Tape Dental Injury: Teeth and Oropharynx as per pre-operative assessment

## 2023-01-04 NOTE — Discharge Instructions (Signed)

## 2023-01-04 NOTE — Anesthesia Postprocedure Evaluation (Signed)
Anesthesia Post Note  Patient: [name]  Procedure(s) Performed: DILATATION AND EVACUATION  Patient location during evaluation: PACU Anesthesia Type: General Level of consciousness: awake and alert Pain management: pain level controlled Vital Signs Assessment: post-procedure vital signs reviewed and stable Respiratory status: spontaneous breathing, nonlabored ventilation, respiratory function stable and patient connected to nasal cannula oxygen Cardiovascular status: blood pressure returned to baseline and stable Postop Assessment: no apparent nausea or vomiting Anesthetic complications: no   No notable events documented.   Last Vitals:  Vitals:   01/04/23 0800 01/04/23 0809  BP: 104/67 97/73  Pulse: 96 94  Resp: 10 20  Temp: 36.6 C 36.7 C  SpO2: 100% 100%    Last Pain:  Vitals:   01/04/23 0809  TempSrc: Temporal  PainSc: 0-No pain                 Lenard Simmer

## 2023-01-04 NOTE — Transfer of Care (Signed)
Immediate Anesthesia Transfer of Care Note  Patient: Ebony [name]  Procedure(s) Performed: DILATATION AND EVACUATION  Patient Location: PACU  Anesthesia Type:General  Level of Consciousness: drowsy  Airway & Oxygen Therapy: Patient Spontanous Breathing and Patient connected to face mask oxygen  Post-op Assessment: Report given to RN and Post -op Vital signs reviewed and stable  Post vital signs: Reviewed and stable  Last Vitals:  Vitals Value Taken Time  BP 105/68 01/04/23 0737  Temp 36.8 C 01/04/23 0737  Pulse 100 01/04/23 0741  Resp 17 01/04/23 0741  SpO2 100 % 01/04/23 0741  Vitals shown include unfiled device data.  Last Pain:  Vitals:   01/04/23 0737  TempSrc: Temporal  PainSc: Asleep         Complications: No notable events documented.

## 2023-01-04 NOTE — ED Notes (Signed)
Pt left to OR!

## 2023-01-04 NOTE — Consult Note (Addendum)
Consult Note  Requesting Attending Physician :  Irean Hong, MD Service Requesting Consult : Emergency Department  Assessment/Recommendations:  Ebony [name] is a 27 y.o. [phone] seen in consultation at the request of Irean Hong, MD regarding SAB with heavy vaginal bleeding.  -1 U PRBC for symptomatic acute blood loss anemia -800 mcg cytotec PR -Discussed plan of care with Dr. Logan Bores, who recommends D&E   Active Problems:   Spontaneous abortion with hemorrhage   HPI Ebony [name] had a miscarriage diagnosed in the ED on 12/31/22. Her bhCG at that time was 55, 429 mIU/mL. Since then, she has had heavy bleeding and clots. Her bleeding has worsened over the past 2 days, and she presented to the ED early this AM. Her hgb has dropped from 11 to 7.9 and she is hypotensive and lightheaded. She has passed several large clots but does not believe she has passed any tissue.   Reason for Consult:  Spontaneous abortion with hemorrhage   Allergies: No Known Allergies   Medications:  No current facility-administered medications for this encounter. No current outpatient medications on file.   Medical History: Past Medical History:  Diagnosis Date   Acne    Eczema, allergic    History of tonsillectomy    Medical history non-contributory      Surgical History: Past Surgical History:  Procedure Laterality Date   ADENOIDECTOMY     DILATION AND EVACUATION N/A 05/29/2017   Procedure: DILATATION AND EVACUATION;  Surgeon: Conard Novak, MD;  Location: [facility] ORS;  Service: Gynecology;  Laterality: N/A;   TONSILLECTOMY     WISDOM TOOTH EXTRACTION       Obstetric History:  OB History  Gravida Para Term Preterm AB Living  5 3 3   1 3   SAB IAB Ectopic Multiple Live Births  0 0 1 0 3    # Outcome Date GA Lbr Len/2nd Weight Sex Type Anes PTL Lv  5 Current           4 Term 10/02/21 [redacted]w[redacted]d   F    LIV  3 Term 04/08/18    F Vag-Spont   LIV  2 Ectopic 2019          1 Term  05/06/14 [redacted]w[redacted]d 10:00 / 00:28 3215 g M Vag-Spont EPI  LIV     Birth Comments: none    GYN History:   Social History: Social History   Socioeconomic History   Marital status: Single    Spouse name: Not on file   Number of children: Not on file   Years of education: Not on file   Highest education level: Not on file  Occupational History   Not on file  Tobacco Use   Smoking status: Never   Smokeless tobacco: Never  Vaping Use   Vaping status: Never Used  Substance and Sexual Activity   Alcohol use: Not Currently    Comment: last use 3 weeks ago - "mixed drink" x 1   Drug use: Not Currently    Types: Marijuana    Comment: last used 2019    Sexual activity: Yes    Birth control/protection: I.U.D., Pill, Implant, None  Other Topics Concern   Not on file  Social History Narrative   Not on file   Social Determinants of Health   Financial Resource Strain: Not on file  Food Insecurity: Not on file  Transportation Needs: Not on file  Physical Activity: Not on file  Stress: Not  on file  Social Connections: Not on file    Family History: family history includes Diabetes in her brother.  Review of Systems: Constitutional: lightheaded with standing, no fever, malaise, chills Cardiac: no chest pain or SOB Respiratory: no increased WOB, cough, wheezing GI: negative GU: cramping, vaginal bleeding, denies abnormal discharge or odor  Objective :  Vital signs in last 24 hours: Temp:  [97.9 F (36.6 C)-98.1 F (36.7 C)] 98.1 F (36.7 C) (08/31 0557) Pulse Rate:  [96-115] 100 (08/31 0557) Resp:  [18-24] 18 (08/31 0557) BP: (81-109)/(54-66) 101/66 (08/31 0557) SpO2:  [98 %-100 %] 100 % (08/31 0557) Weight:  [95.3 kg] 95.3 kg (08/30 0955)  Intake/Output last 3 shifts: No intake/output data recorded.   Physical Exam: General: alert, cooperative, appears pale Heart: RRR, mildly tachycardic, no murmur Lungs: CTAB Abdomen: soft, mildly tender to palpation, no guarding,  no rebound tenderness Pelvic exam: VULVA: normal appearing vulva with no masses, tenderness or lesions, VAGINA: normal appearing vagina with normal color and discharge, no lesions, moderate bleeding and clots CERVIX: obscured by large clots.   Procedure Note Clots were gently removed from the vagina and cervical os with ring forceps and gauze. Approximately 150 mL of clots were removed, and there was approximately 100 mL of additional vaginal bleeding.   Test Results  bhCG: 14.370 mIU/mL  Blood type/Rh: A POS   Imaging: ----------------------------------------------------------- [place] Report CLINICAL DATA:  Vaginal bleeding in early pregnancy.   EXAM: OBSTETRIC <14 WK ULTRASOUND   TECHNIQUE: Transabdominal ultrasound was performed for evaluation of the gestation as well as the maternal uterus and adnexal regions.   COMPARISON:  12/31/2022   FINDINGS: Intrauterine gestational sac: None   Yolk sac:  Not Visualized.   Embryo:  Not Visualized.   Cardiac Activity: Not Visualized.   Heart Rate: NA bpm   Subchorionic hemorrhage:  NA.   Maternal uterus/adnexae:   Right ovary: Normal   Left ovary: Normal   Other :The endometrium measures 1.7 cm in thickness previously 2.3 cm. Echogenic structure within the cervical canal is identified which may reflect blood clot. This measures approximately 3.9 cm in transverse diameter.   Free fluid:  No free fluid.   IMPRESSION: 1. No intrauterine gestational sac, yolk sac, or fetal pole identified. Differential considerations include intrauterine pregnancy too early to be sonographically visualized, missed abortion, or ectopic pregnancy. Followup ultrasound is recommended in 10-14 days for further evaluation. 2. Thickened endometrial stripe measuring 1.7 cm. Previously this measured 2.3 cm. Echogenic masslike structure within the cervical canal measuring up to 3.9 cm is concerning for blood clot. Note: In the setting of a known  spontaneous abortion endometrial thickness of more than 10 mm may indicate retained products. Careful clinical correlation is advised Electronically Signed   By: Signa Kell M.D.   On: 01/04/2023 05:52 ---------------------------------------------  I spent approximately 60 minutes involved in the care of this patient preparing to see the patient by obtaining and reviewing her medical history (including labs, imaging tests and prior procedures), documenting clinical information in the electronic health record (EHR), counseling and coordinating care plans, writing and sending prescriptions, ordering tests or procedures and in direct communicating with the patient and medical staff discussing pertinent items from her history and physical exam.   Guadlupe Spanish, CNM 01/04/2023 5:59 AM

## 2023-01-04 NOTE — ED Notes (Addendum)
First nurse note: pt from home via [facility] c/o pelvic pain, bleeding, and dizziness. Was here for same on Monday. Found to be having miscarriage. Pt still bleeding has gone through 4 pads in 1 hr.  80/50 120HR 99%RA 209CBG

## 2023-01-04 NOTE — Anesthesia Preprocedure Evaluation (Signed)
Anesthesia Evaluation  Patient identified by MRN, date of birth, ID band Patient awake    Reviewed: Allergy & Precautions, NPO status , Patient's Chart, lab work & pertinent test results  History of Anesthesia Complications Negative for: history of anesthetic complications  Airway Mallampati: III  TM Distance: >3 FB Neck ROM: Full    Dental no notable dental hx. (+) Teeth Intact   Pulmonary neg pulmonary ROS, neg sleep apnea, neg COPD, Patient abstained from smoking.Not current smoker   Pulmonary exam normal breath sounds clear to auscultation       Cardiovascular Exercise Tolerance: Good METS(-) hypertension(-) CAD and (-) Past MI negative cardio ROS (-) dysrhythmias  Rhythm:Regular Rate:Normal - Systolic murmurs    Neuro/Psych negative neurological ROS  negative psych ROS   GI/Hepatic ,neg GERD  ,,(+)     (-) substance abuse    Endo/Other  neg diabetes    Renal/GU negative Renal ROS     Musculoskeletal   Abdominal  (+) + obese  Peds  Hematology  (+) Blood dyscrasia, anemia   Anesthesia Other Findings Past Medical History: No date: Acne No date: Eczema, allergic No date: History of tonsillectomy No date: Medical history non-contributory  Reproductive/Obstetrics Incomplete abortion                             Anesthesia Physical Anesthesia Plan  ASA: 2 and emergent  Anesthesia Plan: General   Post-op Pain Management: Ofirmev IV (intra-op)*   Induction: Intravenous  PONV Risk Score and Plan: 4 or greater and Ondansetron, Dexamethasone and Midazolam  Airway Management Planned: Oral ETT and Video Laryngoscope Planned  Additional Equipment: None  Intra-op Plan:   Post-operative Plan: Extubation in OR  Informed Consent: I have reviewed the patients History and Physical, chart, labs and discussed the procedure including the risks, benefits and alternatives for the proposed  anesthesia with the patient or authorized representative who has indicated his/her understanding and acceptance.     Dental advisory given  Plan Discussed with: CRNA and Surgeon  Anesthesia Plan Comments: (Discussed risks of anesthesia with patient, including PONV, sore throat, lip/dental/eye damage. Rare risks discussed as well, such as cardiorespiratory and neurological sequelae, and allergic reactions. Discussed the role of CRNA in patient's perioperative care. Patient understands.)       Anesthesia Quick Evaluation

## 2023-01-04 NOTE — ED Provider Notes (Signed)
[facility] Provider Note    Event Date/Time   First MD Initiated Contact with Patient 01/04/23 0400     (approximate)   History   Vaginal Bleeding   HPI  Ebony [name] is a 27 y.o. female approximately [redacted] weeks pregnant by dates who presents to the emergency department from home with a chief complaint of heavy vaginal bleeding.  Patient was seen by her OB/GYN yesterday in the office with hCG 5500 and ultrasound demonstrating pregnancy of unknown location with heterogeneous thickened appearance of the endometrium, question abortion in progress.  She was advised to return next week for repeat hCG and probable imaging.  Presents tonight for heavy bleeding, pelvic cramps and dizziness.  Denies fever/chills, chest pain, shortness of breath, nausea or vomiting.  Past Medical History   Past Medical History:  Diagnosis Date   Acne    Eczema, allergic    History of tonsillectomy    Medical history non-contributory      Active Problem List   Patient Active Problem List   Diagnosis Date Noted   Obesity (BMI 30-39.9) 07/19/2021   Allergic contact dermatitis 12/17/2014     Past Surgical History   Past Surgical History:  Procedure Laterality Date   ADENOIDECTOMY     DILATION AND EVACUATION N/A 05/29/2017   Procedure: DILATATION AND EVACUATION;  Surgeon: Conard Novak, MD;  Location: [facility] ORS;  Service: Gynecology;  Laterality: N/A;   TONSILLECTOMY     WISDOM TOOTH EXTRACTION       Home Medications   Prior to Admission medications   Medication Sig Start Date End Date Taking? Authorizing Provider  sodium chloride (OCEAN) 0.65 % SOLN nasal spray Place 1 spray into both nostrils as needed for congestion. 02/09/18 06/05/20  Enid Derry, PA-C     Allergies  Patient has no known allergies.   Family History   Family History  Problem Relation Age of Onset   Diabetes Brother      Physical Exam  Triage Vital Signs: ED Triage Vitals   Encounter Vitals Group     BP 01/04/23 0349 (!) 81/62     Systolic BP Percentile --      Diastolic BP Percentile --      Pulse Rate 01/04/23 0349 (!) 115     Resp 01/04/23 0349 (!) 24     Temp --      Temp src --      SpO2 01/04/23 0349 100 %     Weight --      Height --      Head Circumference --      Peak Flow --      Pain Score 01/04/23 0350 10     Pain Loc --      Pain Education --      Exclude from Growth Chart --     Updated Vital Signs: BP (!) 98/54   Pulse (!) 107   Temp 98.1 F (36.7 C) (Oral)   Resp 20   LMP 10/03/2022 (Exact Date)   SpO2 100%    General: Awake, moderate distress.  CV:  Tachycardic.  Good peripheral perfusion.  Resp:  Increased effort.  CTAB. Abd:  Tender to palpation pelvis without rebound or guarding.  No distention.  Other:  Pale.   ED Results / Procedures / Treatments  Labs (all labs ordered are listed, but only abnormal results are displayed) Labs Reviewed  CBC WITH DIFFERENTIAL/PLATELET - Abnormal; Notable for the following components:  Result Value   WBC 11.6 (*)    RBC 2.66 (*)    Hemoglobin 7.9 (*)    HCT 23.9 (*)    All other components within normal limits  COMPREHENSIVE METABOLIC PANEL - Abnormal; Notable for the following components:   Sodium 132 (*)    Potassium 3.2 (*)    CO2 18 (*)    Glucose, Bld 177 (*)    Calcium 8.1 (*)    Total Protein 5.7 (*)    Albumin 2.4 (*)    Total Bilirubin 0.1 (*)    All other components within normal limits  HCG, QUANTITATIVE, PREGNANCY - Abnormal; Notable for the following components:   hCG, Beta Chain, Quant, S 14,370 (*)    All other components within normal limits  TYPE AND SCREEN  PREPARE RBC (CROSSMATCH)     EKG  ED ECG REPORT I, Teesha Ohm J, the attending physician, personally viewed and interpreted this ECG.   Date: 01/04/2023  EKG Time: 0353  Rate: 113  Rhythm: sinus tachycardia  Axis: Normal  Intervals:none  ST&T Change: Nonspecific    RADIOLOGY I  have independently visualized and interpreted patient's ultrasound as well as noted the radiology interpretation:  Ultrasound: No IU gestational sac, yolk sac or fetal pole; thickened endometrial stripe  Official radiology report(s): US OB LESS THAN 14 WEEKS WITH OB TRANSVAGINAL  Result Date: 01/04/2023 CLINICAL DATA:  Vaginal bleeding in early pregnancy. EXAM: OBSTETRIC <14 WK ULTRASOUND TECHNIQUE: Transabdominal ultrasound was performed for evaluation of the gestation as well as the maternal uterus and adnexal regions. COMPARISON:  12/31/2022 FINDINGS: Intrauterine gestational sac: None Yolk sac:  Not Visualized. Embryo:  Not Visualized. Cardiac Activity: Not Visualized. Heart Rate: NA bpm Subchorionic hemorrhage:  NA. Maternal uterus/adnexae: Right ovary: Normal Left ovary: Normal Other :The endometrium measures 1.7 cm in thickness previously 2.3 cm. Echogenic structure within the cervical canal is identified which may reflect blood clot. This measures approximately 3.9 cm in transverse diameter. Free fluid:  No free fluid. IMPRESSION: 1. No intrauterine gestational sac, yolk sac, or fetal pole identified. Differential considerations include intrauterine pregnancy too early to be sonographically visualized, missed abortion, or ectopic pregnancy. Followup ultrasound is recommended in 10-14 days for further evaluation. 2. Thickened endometrial stripe measuring 1.7 cm. Previously this measured 2.3 cm. Echogenic masslike structure within the cervical canal measuring up to 3.9 cm is concerning for blood clot. Note: In the setting of a known spontaneous abortion endometrial thickness of more than 10 mm may indicate retained products. Careful clinical correlation is advised. Electronically Signed   By: Signa Kell M.D.   On: 01/04/2023 05:52     PROCEDURES:  Critical Care performed: Yes, see critical care procedure note(s)  CRITICAL CARE Performed by: Irean Hong   Total critical care time: 30  minutes  Critical care time was exclusive of separately billable procedures and treating other patients.  Critical care was necessary to treat or prevent imminent or life-threatening deterioration.  Critical care was time spent personally by me on the following activities: development of treatment plan with patient and/or surrogate as well as nursing, discussions with consultants, evaluation of patient's response to treatment, examination of patient, obtaining history from patient or surrogate, ordering and performing treatments and interventions, ordering and review of laboratory studies, ordering and review of radiographic studies, pulse oximetry and re-evaluation of patient's condition.   Marland Kitchen1-3 Lead EKG Interpretation  Performed by: Irean Hong, MD Authorized by: Irean Hong, MD     Interpretation:  abnormal     ECG rate:  115   ECG rate assessment: tachycardic     Rhythm: sinus tachycardia     Ectopy: none     Conduction: normal   Comments:     Patient placed on cardiac monitor to evaluate for arrhythmias    MEDICATIONS ORDERED IN ED: Medications  lactated ringers bolus 1,000 mL (0 mLs Intravenous Stopped 01/04/23 0516)  0.9 %  sodium chloride infusion (10 mL/hr Intravenous New Bag/Given 01/04/23 0520)  ondansetron (ZOFRAN) injection 4 mg (4 mg Intravenous Given 01/04/23 0440)  fentaNYL (SUBLIMAZE) injection 50 mcg (50 mcg Intravenous Given 01/04/23 0441)  misoprostol (CYTOTEC) tablet 800 mcg (800 mcg Rectal Given 01/04/23 0534)     IMPRESSION / MDM / ASSESSMENT AND PLAN / ED COURSE  I reviewed the triage vital signs and the nursing notes.                             27 year old female approximately [redacted] weeks pregnant by dates presenting with heavy vaginal bleeding. Differential diagnosis includes, but is not limited to, ovarian cyst, ovarian torsion, acute appendicitis, diverticulitis, urinary tract infection/pyelonephritis, endometriosis, bowel obstruction, colitis, renal colic,  gastroenteritis, hernia, fibroids, endometriosis, pregnancy related pain including ectopic pregnancy, etc. I have personally reviewed patient's records and note her GYN office visit from yesterday.  Patient's presentation is most consistent with acute presentation with potential threat to life or bodily function.  The patient is on the cardiac monitor to evaluate for evidence of arrhythmia and/or significant heart rate changes.  Patient is tachycardic and hypotensive.  Initiate immediate IV fluid resuscitation with lactated Ringer's.  Keep NPO.  Check basic lab work, consult OB/GYN.  Clinical Course as of 01/04/23 0555  Sat Jan 04, 2023  0425 Hemoglobin 7.9 compared with 11 just 4 days ago. [JS]  [phone] Discussed case with OB/GYN midwife Chryl Heck who does recommend blood transfusion given symptomatic anemia and will evaluate patient in emergency department.  Of note, patient just passed a large piece of tissue involving clot and probably fetus/placenta. [JS]  [phone] Patient seen by midwife who consulted OB/GYN attending; plan for D&E. [JS]    Clinical Course User Index [JS] Irean Hong, MD     FINAL CLINICAL IMPRESSION(S) / ED DIAGNOSES   Final diagnoses:  Miscarriage  Symptomatic anemia  Vaginal bleeding in pregnancy  Hypotension, unspecified hypotension type     Rx / DC Orders   ED Discharge Orders     None        Note:  This document was prepared using Dragon voice recognition software and may include unintentional dictation errors.   Irean Hong, MD 01/04/23 [phone]

## 2023-01-04 NOTE — H&P (Addendum)
PRE-OPERATIVE HISTORY AND PHYSICAL EXAM  PCP:  Pcp, No Subjective:   HPI:  Ebony [name] is a 27 y.o. [id number].  Patient's last menstrual period was 10/03/2022 (exact date). Currently in the ED for continued heavy bleeding.  She has the following symptoms:  Incomplete Ab  Review of Systems:   Constitutional: Denied constitutional symptoms, night sweats, recent illness, fatigue, fever, insomnia and weight loss.  Eyes: Denied eye symptoms, eye pain, photophobia, vision change and visual disturbance.  Ears/Nose/Throat/Neck: Denied ear, nose, throat or neck symptoms, hearing loss, nasal discharge, sinus congestion and sore throat.  Cardiovascular: Denied cardiovascular symptoms, arrhythmia, chest pain/pressure, edema, exercise intolerance, orthopnea and palpitations.  Respiratory: Denied pulmonary symptoms, asthma, pleuritic pain, productive sputum, cough, dyspnea and wheezing.  Gastrointestinal: Denied, gastro-esophageal reflux, melena, nausea and vomiting.  Genitourinary: See HPI  Musculoskeletal: Denied musculoskeletal symptoms, stiffness, swelling, muscle weakness and myalgia.  Dermatologic: Denied dermatology symptoms, rash and scar.  Neurologic: Denied neurology symptoms, dizziness, headache, neck pain and syncope.  Psychiatric: Denied psychiatric symptoms, anxiety and depression.  Endocrine: Denied endocrine symptoms including hot flashes and night sweats.   OB History  Gravida Para Term Preterm AB Living  5 3 3   1 3   SAB IAB Ectopic Multiple Live Births  0 0 1 0 3    # Outcome Date GA Lbr Len/2nd Weight Sex Type Anes PTL Lv  5 Current           4 Term 10/02/21 [redacted]w[redacted]d   F    LIV  3 Term 04/08/18    F Vag-Spont   LIV  2 Ectopic 2019          1 Term 05/06/14 [redacted]w[redacted]d 10:00 / 00:28 3215 g M Vag-Spont EPI  LIV     Birth Comments: none    Past Medical History:  Diagnosis Date   Acne    Eczema, allergic    History of tonsillectomy    Medical history non-contributory      Past Surgical History:  Procedure Laterality Date   ADENOIDECTOMY     DILATION AND EVACUATION N/A 05/29/2017   Procedure: DILATATION AND EVACUATION;  Surgeon: Conard Novak, MD;  Location: [facility] ORS;  Service: Gynecology;  Laterality: N/A;   TONSILLECTOMY     WISDOM TOOTH EXTRACTION        SOCIAL HISTORY:  Social History   Tobacco Use  Smoking Status Never  Smokeless Tobacco Never   Social History   Substance and Sexual Activity  Alcohol Use Not Currently   Comment: last use 3 weeks ago - "mixed drink" x 1    Social History   Substance and Sexual Activity  Drug Use Not Currently   Types: Marijuana   Comment: last used 2019     Family History  Problem Relation Age of Onset   Diabetes Brother     ALLERGIES:  Patient has no known allergies.  MEDS:   No current facility-administered medications on file prior to encounter.   Current Outpatient Medications on File Prior to Encounter  Medication Sig Dispense Refill   [DISCONTINUED] sodium chloride (OCEAN) 0.65 % SOLN nasal spray Place 1 spray into both nostrils as needed for congestion. 1 Bottle 0    Meds ordered this encounter  Medications   lactated ringers bolus 1,000 mL   0.9 %  sodium chloride infusion   ondansetron (ZOFRAN) injection 4 mg   fentaNYL (SUBLIMAZE) injection 50 mcg   misoprostol (CYTOTEC) tablet 800 mcg  Physical examination BP 101/66   Pulse 100   Temp 98.1 F (36.7 C) (Oral)   Resp 18   LMP 10/03/2022 (Exact Date)   SpO2 100%   General NAD, Conversant  HEENT Atraumatic; Op clear with mmm.  Normo-cephalic. Pupils reactive. Anicteric sclerae  Thyroid/Neck Smooth without nodularity or enlargement. Normal ROM.  Neck Supple.  Skin No rashes, lesions or ulceration. Normal palpated skin turgor. No nodularity.  Breasts: No masses or discharge.  Symmetric.  No axillary adenopathy.  Lungs: Clear to auscultation.No rales or wheezes. Normal Respiratory effort, no retractions.   Heart: NSR.  No murmurs or rubs appreciated. No peripheral edema  Abdomen: Soft.  Non-tender.  No masses.  No HSM. No hernia  Extremities: Moves all appropriately.  Normal ROM for age. No lymphadenopathy.  Neuro: Oriented to PPT.  Normal mood. Normal affect.     Pelvic: See [place] - also deferred to OR   Assessment:   [id number] Patient Active Problem List   Diagnosis Date Noted   Spontaneous abortion with hemorrhage 01/04/2023   Obesity (BMI 30-39.9) 07/19/2021   Allergic contact dermatitis 12/17/2014    1. Miscarriage   2. Symptomatic anemia   3. Vaginal bleeding in pregnancy   4. Hypotension, unspecified hypotension type      Plan:   Orders: Meds ordered this encounter  Medications   lactated ringers bolus 1,000 mL   0.9 %  sodium chloride infusion   ondansetron (ZOFRAN) injection 4 mg   fentaNYL (SUBLIMAZE) injection 50 mcg   misoprostol (CYTOTEC) tablet 800 mcg     1.  D & E for incomplete Ab  Pre-op discussions regarding Risks and Benefits of her scheduled surgery.  D&C/E The procedure and the risks and benefits of dilation and curettage/evacuation have been explained to the patient.  The specific risks of bleeding, infection, anesthesia, uterine perforation, and damage to bowel or bladder  have been specifically discussed.  I have answered all of her questions and I believe that she has an adequate and informed understanding of this procedure.   Elonda Husky, M.D. 01/04/2023 6:28 AM

## 2023-01-04 NOTE — Op Note (Signed)
   OPERATIVE NOTE 01/04/2023 7:30 AM  PRE-OPERATIVE DIAGNOSIS:  1) incomplete abortion  POST-OPERATIVE DIAGNOSIS:  * No Diagnosis Codes entered *  OPERATION:  D&E/C  SURGEON(S): Surgeons and Role:    Linzie Collin, MD - Primary   ANESTHESIA: General  ESTIMATED BLOOD LOSS:  OPERATIVE FINDINGS: Large amount of uterine contents c/w miscarriage   SPECIMEN:  ID Type Source Tests Collected by Time Destination  1 : Uterine Contents Tissue Path Tissue SURGICAL PATHOLOGY Linzie Collin, MD 01/04/2023 0720     COMPLICATIONS: None  DRAINS: None  DISPOSITION: Stable to recovery room  DESCRIPTION OF PROCEDURE:      The patient was prepped and draped in the dorsal lithotomy position and placed under general anesthesia. Her bladder was emptied. The cervix was grasped with a Jacob's tenaculum. Respecting the position and curvature of her cervix, it was dilated to accommodate a number 10 suction curette. The suction curette was placed within the endometrial cavity and a pressure greater than 65 mmHg was allowed to build. A systematic curettage was performed in all quadrants.  Using ring forceps a large amount of tissue was extracted from the cervical os. A gentle sharp curette was performed in all quadrants with minimal further tissue obtained. The suction curette was was again placed and no additional tissue was noted. The uterus became firm and globular. Pitocin was run in the IV. The tenaculum was removed from the cervix and hemostasis was noted. The weighted speculum was removed and the patient went to recovery room in stable condition.  No follow-up provider specified.  Elonda Husky, M.D. 01/04/2023 7:30 AM

## 2023-01-04 NOTE — ED Notes (Addendum)
Pt signed paper consent for blood transfusion. RN witnessed. Pt had no further questions at this time. Paper consent in pts chart.

## 2023-01-05 LAB — TYPE AND SCREEN
ABO/RH(D): A POS
Antibody Screen: NEGATIVE
Unit division: 0

## 2023-01-05 LAB — CULTURE, BLOOD (ROUTINE X 2): Culture: NO GROWTH

## 2023-01-05 LAB — BPAM RBC
Blood Product Expiration Date: 202409252359
ISSUE DATE / TIME: 202408310506
Unit Type and Rh: 6200

## 2023-01-08 ENCOUNTER — Encounter: Payer: Self-pay | Admitting: Obstetrics

## 2023-01-08 ENCOUNTER — Ambulatory Visit (INDEPENDENT_AMBULATORY_CARE_PROVIDER_SITE_OTHER): Payer: Medicaid Other | Admitting: Obstetrics

## 2023-01-08 ENCOUNTER — Other Ambulatory Visit: Payer: Medicaid Other

## 2023-01-08 VITALS — BP 104/73 | HR 88 | Ht 66.0 in | Wt 217.0 lb

## 2023-01-08 DIAGNOSIS — R1084 Generalized abdominal pain: Secondary | ICD-10-CM

## 2023-01-08 DIAGNOSIS — O039 Complete or unspecified spontaneous abortion without complication: Secondary | ICD-10-CM

## 2023-01-08 DIAGNOSIS — Z4889 Encounter for other specified surgical aftercare: Secondary | ICD-10-CM

## 2023-01-08 DIAGNOSIS — Z30430 Encounter for insertion of intrauterine contraceptive device: Secondary | ICD-10-CM

## 2023-01-08 MED ORDER — MISOPROSTOL 200 MCG PO TABS
200.0000 ug | ORAL_TABLET | Freq: Once | ORAL | 0 refills | Status: AC
Start: 2023-01-08 — End: 2023-01-08

## 2023-01-08 NOTE — Patient Instructions (Signed)
Intrauterine Device Insertion An intrauterine device (IUD) is a medical device that is inserted into the uterus to prevent pregnancy. It is a small, T-shaped device that has one or two nylon strings hanging down from it. The strings hang out of the lower part of the uterus (cervix) to allow for future IUD removal. There are two types of IUDs: Hormone IUD. This type of IUD is made of plastic and contains the hormone progestin (synthetic progesterone). A hormone IUD may last 3-5 years, depending on which one you have. Synthetic progesterone prevents pregnancy by: Thickening cervical mucus to prevent sperm from entering the uterus. Thinning the uterine lining to prevent a fertilized egg from implanting there. Copper IUD. This type of IUD has copper wire wrapped around it. A copper IUD may last up to 10 years. Copper prevents pregnancy by making the uterus and fallopian tubes produce a fluid that kills sperm. Tell a health care provider about: Any allergies you have. All medicines you are taking, including vitamins, herbs, eye drops, creams, and over-the-counter medicines. Any surgeries you have had. Any medical conditions you have, including any sexually transmitted infections (STIs) you may have. Whether you are pregnant or may be pregnant. What are the risks? Generally, this is a safe procedure. However, problems may occur, including: Infection. Bleeding. Allergic reactions to medicines. Puncture (perforation) of the uterus or damage to other structures or organs. Accidental placement of the IUD either in the muscle layer of the uterus (myometrium) or outside the uterus. The IUD falling out of the uterus (expulsion). This is more common among women who have recently had a child. Higher risk of an egg being fertilized outside your uterus (ectopic pregnancy).This is rare. Pelvic inflammatory disease (PID), which is an infection in the uterus and fallopian tubes. The IUD does not cause the  infection. The infection is usually from an unknown sexually transmitted infection (STI). This is rare, and it usually happens during the first 20 days after the IUD is inserted. What happens before the procedure? Ask your health care provider about: Changing or stopping your regular medicines. This is especially important if you are taking diabetes medicines or blood thinners. Taking over-the-counter medicines, vitamins, herbs, and supplements. Talk with your health care provider about when to schedule your IUD placement. Your health care provider may recommend taking over-the-counter pain medicines before the procedure. These medicines include ibuprofen and naproxen. You may have tests for: Pregnancy. A pregnancy test involves having a urine or blood sample taken. Sexually transmitted infections (STIs). Placing an IUD in someone who has an STI can make the infection worse. Cervical cancer. You may have a Pap test to check for this type of cancer. This means collecting cells from your cervix to be checked under a microscope. You may have a physical exam to determine the size and position of your uterus. What happens during the procedure? A tool (speculum) will be placed in your vagina and widened so that your health care provider can see your cervix. Medicine, or antiseptic, may be applied to your cervix to help lower your risk of infection. You may be given an anesthetic medicine to numb each side of your cervix. This medicine is usually given by an injection into the cervix. A tool called a uterine sound will be inserted into your uterus to check the length of your uterus and the direction that your uterus may be tilted. A slim instrument or tube (IUD inserter) that holds the IUD will be inserted into your vagina,  through your cervical canal, and into your uterus. The IUD will be placed in the uterus, and the IUD inserter will be removed. The strings that are attached to the IUD will be trimmed  so that they lie just below the cervix. The speculum will be removed. The procedure may vary among health care providers and hospitals. What can I expect after procedure? You may have bleeding after the procedure. This is normal. It varies from light bleeding (spotting) for a few days to menstrual-like bleeding. You may have cramping and pain in the abdomen. You may feel dizzy or light-headed. You may have lower back pain. You may have headaches and nausea. Follow these instructions at home: Before resuming sexual activity, check to make sure that you can feel the IUD string or strings. You should be able to feel the end of the string below the opening of your cervix. If your IUD string is in place, you may resume sexual activity. If you had a hormonal IUD inserted more than 7 days after your most recent period started, you will need to use a backup method of birth control for 7 days after IUD insertion. Ask your health care provider whether this applies to you. Continue to check that the IUD is still in place by feeling for the strings after every menstrual period, or once a month. An IUD will not protect you from sexually transmitted infections (STIs). Use methods to prevent the exchange of body fluids between partners (barrier protection) every time you have sex. Barrier protection can be used during oral, vaginal, or anal sex. Commonly used barrier methods include: Female condom. Female condom. Dental dam. Take over-the-counter and prescription medicines only as told by your health care provider. Keep all follow-up visits. This is important. Contact a health care provider if: You feel light-headed or weak. You have any of the following problems with your IUD string or strings: The string bothers or hurts you or your sexual partner. You cannot feel the string. The string has gotten longer. You can feel the IUD in your vagina. You think you may be pregnant, or you miss your menstrual  period. You think you may have a sexually transmitted infection (STI). Get help right away if you: You have flu-like symptoms, such as tiredness (fatigue) and muscle aches. You have a fever and chills. You have bleeding that is heavier or lasts longer than a normal menstrual cycle. You have abnormal or bad-smelling discharge from your vagina. You develop abdominal pain that is new, is getting worse, or is not in the same area of earlier cramping and pain. You have pain during sexual activity. Summary An intrauterine device (IUD) is a small, T-shaped device that has one or two nylon strings hanging down from it. You may have a copper IUD or a hormone IUD. Ask your health care provider what you need to do before the procedure. You may have some tests and you may have to change or stop some medicines. You may have bleeding after the procedure. This is normal. It varies from light spotting for a few days to menstrual-like bleeding. Check to make sure that you can feel the IUD strings before you resume sexual activity. Check the strings after every menstrual period or once a month. An IUD does not protect against STIs. Use other methods to protect yourself against infections. This information is not intended to replace advice given to you by your health care provider. Make sure you discuss any questions you have with  your health care provider. Document Revised: 11/03/2019 Document Reviewed: 11/03/2019 Elsevier Patient Education  2024 [employer].

## 2023-01-08 NOTE — Progress Notes (Signed)
Obstetrics & Gynecology Office Visit   Chief Complaint:  Chief Complaint  Patient presents with   Follow-up    History of Present Illness: Ebony [name] presents following an incomplete SAB with subsequent D and C performed by Dr. Logan Bores on 01/04/2023.  She is spotting only lightly now. Denies any fever , cramping or pelvic pain. She would like to discuss contraception. She feels that she is finished having children. She is initially uncertain re her choice of contraception  She had enough vaginal bleeding to require blood infusion due to low H and H and had dizziness and lightheadedness at the hospital. She denies this today.   Review of Systems:  Review of Systems  Constitutional: Negative.   HENT: Negative.    Eyes: Negative.   Respiratory: Negative.    Cardiovascular: Negative.   Genitourinary: Negative.   Musculoskeletal: Negative.   Skin: Negative.   Neurological: Negative.   Endo/Heme/Allergies: Negative.   Psychiatric/Behavioral: Negative.       Past Medical History:  Past Medical History:  Diagnosis Date   Acne    Eczema, allergic    History of tonsillectomy    Medical history non-contributory     Past Surgical History:  Past Surgical History:  Procedure Laterality Date   ADENOIDECTOMY     DILATION AND EVACUATION N/A 05/29/2017   Procedure: DILATATION AND EVACUATION;  Surgeon: Conard Novak, MD;  Location: [facility];  Service: Gynecology;  Laterality: N/A;   DILATION AND EVACUATION N/A 01/04/2023   Procedure: DILATATION AND EVACUATION;  Surgeon: Linzie Collin, MD;  Location: [facility];  Service: Gynecology;  Laterality: N/A;   TONSILLECTOMY     WISDOM TOOTH EXTRACTION      Gynecologic History: Patient's last menstrual period was 10/03/2022 (exact date).  Obstetric History: [id number]  Family History:  Family History  Problem Relation Age of Onset   Diabetes Brother     Social History:  Social History   Socioeconomic History   Marital status:  Single    Spouse name: Not on file   Number of children: Not on file   Years of education: Not on file   Highest education level: Not on file  Occupational History   Not on file  Tobacco Use   Smoking status: Never   Smokeless tobacco: Never  Vaping Use   Vaping status: Never Used  Substance and Sexual Activity   Alcohol use: Not Currently    Comment: last use 3 weeks ago - "mixed drink" x 1   Drug use: Not Currently    Types: Marijuana    Comment: last used 2019    Sexual activity: Not Currently    Birth control/protection: None  Other Topics Concern   Not on file  Social History Narrative   Not on file   Social Determinants of Health   Financial Resource Strain: Not on file  Food Insecurity: Not on file  Transportation Needs: Not on file  Physical Activity: Not on file  Stress: Not on file  Social Connections: Not on file  Intimate Partner Violence: Not At Risk (11/06/2022)   Humiliation, Afraid, Rape, and Kick questionnaire    Fear of Current or Ex-Partner: No    Emotionally Abused: No    Physically Abused: No    Sexually Abused: No    Allergies:  No Known Allergies  Medications: Prior to Admission medications   Medication Sig Start Date End Date Taking? Authorizing Provider  HYDROcodone-acetaminophen (NORCO/VICODIN) 5-325 MG tablet Take 1-2 tablets by  mouth every 6 (six) hours as needed for moderate pain. 01/04/23  Yes Linzie Collin, MD  sodium chloride (OCEAN) 0.65 % SOLN nasal spray Place 1 spray into both nostrils as needed for congestion. 02/09/18 06/05/20  Enid Derry, PA-C    Physical Exam Vitals:  Vitals:   01/08/23 1040  BP: 104/73  Pulse: 88   Patient's last menstrual period was 10/03/2022 (exact date).  Physical Exam Constitutional:      Appearance: Normal appearance. She is obese.  Cardiovascular:     Rate and Rhythm: Normal rate and regular rhythm.     Pulses: Normal pulses.     Heart sounds: Normal heart sounds.  Pulmonary:      Effort: Pulmonary effort is normal.     Breath sounds: Normal breath sounds.  Abdominal:     Palpations: Abdomen is soft.  Genitourinary:    Comments: Deferred today Musculoskeletal:        General: Normal range of motion.  Skin:    General: Skin is warm and dry.  Neurological:     General: No focal deficit present.     Mental Status: She is alert and oriented to person, place, and time.  Psychiatric:        Mood and Affect: Mood normal.        Behavior: Behavior normal.      Assessment: 27 y.o. [id number] for follow up post SAB and D and C Contraceptive planning  Plan: Problem List Items Addressed This Visit   None Visit Diagnoses     Follow-up visit after miscarriage    -  Primary     We drew a Quant HCG today. Extras time spent discussing birth control, including LARCs, BTLs, the ting, OCPs. As she does not want any more children, extra attention spent discussing permanent sterilization or LARCs. She is interested in trialing the Mirena. She has used this in the past, but had it removed after a few months. Same for the Nexplanon.  We talkied about giving the method additional time to work before having it taken out.  She will f/u in a few weeks for another Quant and the IUD insertion. Cytotec ordered for the IUD appt.  Mirna Mires, CNM  01/08/2023 1:18 PM

## 2023-01-09 ENCOUNTER — Ambulatory Visit: Payer: Medicaid Other | Admitting: Obstetrics and Gynecology

## 2023-01-09 LAB — MISC LABCORP TEST (SEND OUT)
LabCorp test name: 8664
Labcorp test code: 8664

## 2023-01-09 LAB — BETA HCG QUANT (REF LAB): hCG Quant: 464 m[IU]/mL

## 2023-01-13 ENCOUNTER — Encounter: Payer: Self-pay | Admitting: Obstetrics

## 2023-01-14 LAB — CULTURE, BLOOD (ROUTINE X 2)

## 2023-01-16 ENCOUNTER — Ambulatory Visit: Payer: Medicaid Other

## 2023-01-20 ENCOUNTER — Encounter: Payer: Self-pay | Admitting: Obstetrics and Gynecology

## 2023-02-04 ENCOUNTER — Encounter: Payer: Self-pay | Admitting: Obstetrics

## 2023-02-07 ENCOUNTER — Other Ambulatory Visit: Payer: Medicaid Other

## 2023-02-07 DIAGNOSIS — O039 Complete or unspecified spontaneous abortion without complication: Secondary | ICD-10-CM

## 2023-02-08 LAB — BETA HCG QUANT (REF LAB): hCG Quant: 3 m[IU]/mL

## 2023-08-06 ENCOUNTER — Ambulatory Visit: Payer: Self-pay | Admitting: Advanced Practice Midwife

## 2023-08-12 ENCOUNTER — Encounter: Payer: Self-pay | Admitting: Advanced Practice Midwife
